# Patient Record
Sex: Female | Born: 1970 | Race: Black or African American | Hispanic: No | Marital: Single | Smoking: Never smoker
Health system: Southern US, Community
[De-identification: ages and names within clinical notes are randomized; demographics above are authoritative.]

## PROBLEM LIST (undated history)

## (undated) DIAGNOSIS — E079 Disorder of thyroid, unspecified: Secondary | ICD-10-CM

## (undated) DIAGNOSIS — C73 Malignant neoplasm of thyroid gland: Secondary | ICD-10-CM

## (undated) HISTORY — PX: THYROIDECTOMY: SHX17

---

## 1998-08-25 ENCOUNTER — Emergency Department (HOSPITAL_COMMUNITY): Admission: EM | Admit: 1998-08-25 | Discharge: 1998-08-25 | Payer: Self-pay | Admitting: Emergency Medicine

## 2000-02-12 ENCOUNTER — Other Ambulatory Visit: Admission: RE | Admit: 2000-02-12 | Discharge: 2000-02-12 | Payer: Self-pay | Admitting: Obstetrics and Gynecology

## 2001-06-22 ENCOUNTER — Emergency Department (HOSPITAL_COMMUNITY): Admission: EM | Admit: 2001-06-22 | Discharge: 2001-06-22 | Payer: Self-pay

## 2001-06-29 ENCOUNTER — Other Ambulatory Visit: Admission: RE | Admit: 2001-06-29 | Discharge: 2001-06-29 | Payer: Self-pay | Admitting: Otolaryngology

## 2001-07-08 ENCOUNTER — Ambulatory Visit (HOSPITAL_COMMUNITY): Admission: RE | Admit: 2001-07-08 | Discharge: 2001-07-08 | Payer: Self-pay | Admitting: Otolaryngology

## 2001-07-08 ENCOUNTER — Encounter: Payer: Self-pay | Admitting: Otolaryngology

## 2001-07-14 ENCOUNTER — Encounter: Payer: Self-pay | Admitting: Otolaryngology

## 2001-07-14 ENCOUNTER — Ambulatory Visit (HOSPITAL_COMMUNITY): Admission: RE | Admit: 2001-07-14 | Discharge: 2001-07-14 | Payer: Self-pay | Admitting: Otolaryngology

## 2001-08-02 ENCOUNTER — Encounter: Payer: Self-pay | Admitting: Otolaryngology

## 2001-08-02 ENCOUNTER — Ambulatory Visit (HOSPITAL_COMMUNITY): Admission: RE | Admit: 2001-08-02 | Discharge: 2001-08-02 | Payer: Self-pay | Admitting: Otolaryngology

## 2001-08-04 ENCOUNTER — Inpatient Hospital Stay (HOSPITAL_COMMUNITY): Admission: RE | Admit: 2001-08-04 | Discharge: 2001-08-07 | Payer: Self-pay | Admitting: Otolaryngology

## 2001-08-18 ENCOUNTER — Ambulatory Visit: Admission: RE | Admit: 2001-08-18 | Discharge: 2001-11-16 | Payer: Self-pay | Admitting: Radiation Oncology

## 2001-09-12 ENCOUNTER — Ambulatory Visit (HOSPITAL_COMMUNITY): Admission: RE | Admit: 2001-09-12 | Discharge: 2001-09-12 | Payer: Self-pay | Admitting: Oncology

## 2001-09-12 ENCOUNTER — Encounter (HOSPITAL_COMMUNITY): Payer: Self-pay | Admitting: Oncology

## 2001-09-26 ENCOUNTER — Ambulatory Visit (HOSPITAL_COMMUNITY): Admission: RE | Admit: 2001-09-26 | Discharge: 2001-09-26 | Payer: Self-pay | Admitting: Oncology

## 2001-09-26 ENCOUNTER — Encounter (HOSPITAL_COMMUNITY): Payer: Self-pay | Admitting: Oncology

## 2002-01-12 ENCOUNTER — Ambulatory Visit (HOSPITAL_COMMUNITY): Admission: RE | Admit: 2002-01-12 | Discharge: 2002-01-12 | Payer: Self-pay | Admitting: Oncology

## 2002-01-12 ENCOUNTER — Encounter (HOSPITAL_COMMUNITY): Payer: Self-pay | Admitting: Oncology

## 2002-04-14 ENCOUNTER — Inpatient Hospital Stay (HOSPITAL_COMMUNITY): Admission: EM | Admit: 2002-04-14 | Discharge: 2002-04-16 | Payer: Self-pay | Admitting: Emergency Medicine

## 2002-04-14 ENCOUNTER — Encounter: Payer: Self-pay | Admitting: Emergency Medicine

## 2002-04-19 ENCOUNTER — Ambulatory Visit (HOSPITAL_COMMUNITY): Admission: RE | Admit: 2002-04-19 | Discharge: 2002-04-19 | Payer: Self-pay | Admitting: Thoracic Surgery

## 2002-04-19 ENCOUNTER — Encounter: Payer: Self-pay | Admitting: Thoracic Surgery

## 2002-06-07 ENCOUNTER — Other Ambulatory Visit: Admission: RE | Admit: 2002-06-07 | Discharge: 2002-06-07 | Payer: Self-pay | Admitting: Obstetrics and Gynecology

## 2002-07-03 ENCOUNTER — Emergency Department (HOSPITAL_COMMUNITY): Admission: EM | Admit: 2002-07-03 | Discharge: 2002-07-03 | Payer: Self-pay | Admitting: Emergency Medicine

## 2002-07-03 ENCOUNTER — Encounter: Payer: Self-pay | Admitting: Emergency Medicine

## 2002-08-09 ENCOUNTER — Encounter: Admission: RE | Admit: 2002-08-09 | Discharge: 2002-08-09 | Payer: Self-pay | Admitting: Otolaryngology

## 2002-08-09 ENCOUNTER — Encounter: Payer: Self-pay | Admitting: Otolaryngology

## 2002-08-29 ENCOUNTER — Emergency Department (HOSPITAL_COMMUNITY): Admission: EM | Admit: 2002-08-29 | Discharge: 2002-08-29 | Payer: Self-pay | Admitting: Emergency Medicine

## 2002-08-29 ENCOUNTER — Encounter: Payer: Self-pay | Admitting: Emergency Medicine

## 2003-01-01 ENCOUNTER — Ambulatory Visit (HOSPITAL_COMMUNITY): Admission: RE | Admit: 2003-01-01 | Discharge: 2003-01-01 | Payer: Self-pay | Admitting: Oncology

## 2003-02-15 ENCOUNTER — Emergency Department (HOSPITAL_COMMUNITY): Admission: EM | Admit: 2003-02-15 | Discharge: 2003-02-15 | Payer: Self-pay | Admitting: Emergency Medicine

## 2003-02-27 ENCOUNTER — Encounter: Admission: RE | Admit: 2003-02-27 | Discharge: 2003-02-27 | Payer: Self-pay | Admitting: Thoracic Surgery

## 2003-03-02 ENCOUNTER — Ambulatory Visit (HOSPITAL_COMMUNITY): Admission: RE | Admit: 2003-03-02 | Discharge: 2003-03-02 | Payer: Self-pay | Admitting: Thoracic Surgery

## 2003-04-06 ENCOUNTER — Encounter: Payer: Self-pay | Admitting: Emergency Medicine

## 2003-04-07 ENCOUNTER — Inpatient Hospital Stay (HOSPITAL_COMMUNITY): Admission: EM | Admit: 2003-04-07 | Discharge: 2003-04-11 | Payer: Self-pay | Admitting: Thoracic Surgery

## 2003-12-03 ENCOUNTER — Emergency Department (HOSPITAL_COMMUNITY): Admission: EM | Admit: 2003-12-03 | Discharge: 2003-12-03 | Payer: Self-pay

## 2004-01-01 ENCOUNTER — Emergency Department (HOSPITAL_COMMUNITY): Admission: EM | Admit: 2004-01-01 | Discharge: 2004-01-01 | Payer: Self-pay | Admitting: Emergency Medicine

## 2004-03-10 IMAGING — CT CT CHEST W/ CM
1 of 2 series · 14 of 30 positions shown, 18 images · IV contrast (omnipaque)
Comparison: 04/14/2002.

CLINICAL DATA: Metastatic Hurthle cell cancer.  
CT CHEST WITH CONTRAST
Multidetector helical CT imaging is performed through the chest following 100 cc Omnipaque 300 IV.

[Series 2: routine chest · axial · 0.61mm/px · z∈[-244,-34]mm · 14 of 50 slices shown, 18 images]
[im 4/50  mediastinal]
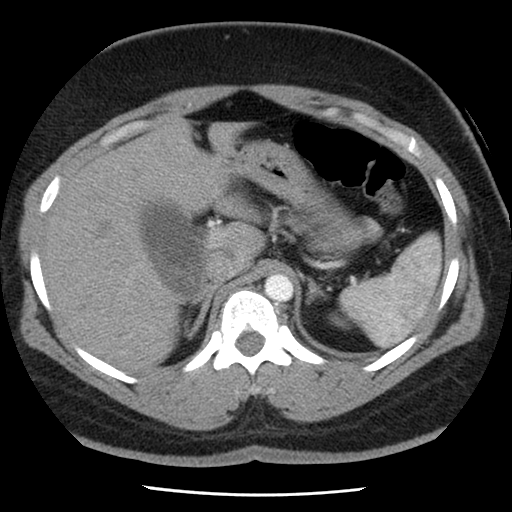
[im 4/50  lung]
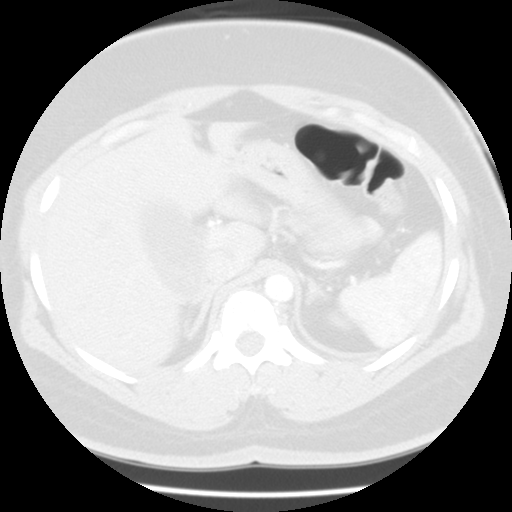
[im 8/50  lung]
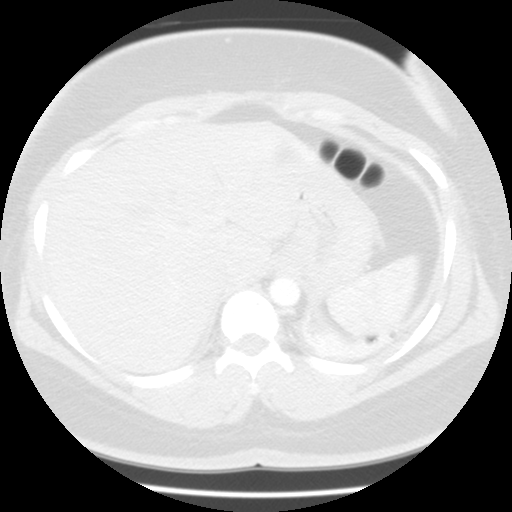
[im 11/50  lung]
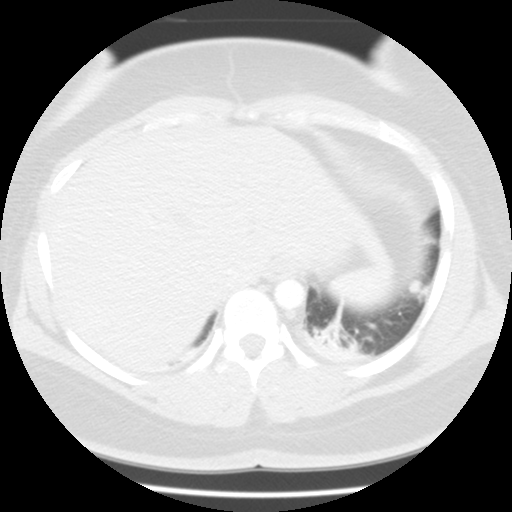
[im 15/50  lung]
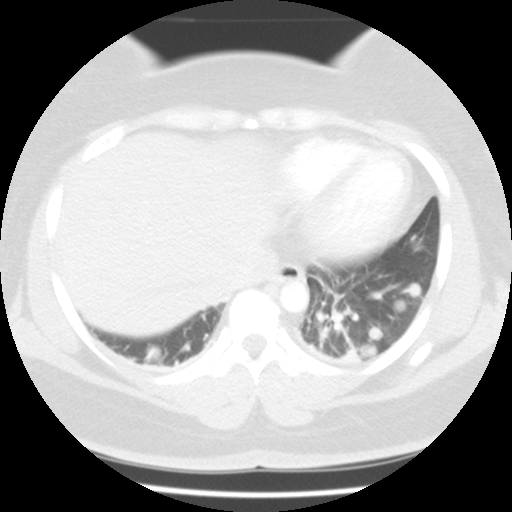
[im 18/50  mediastinal]
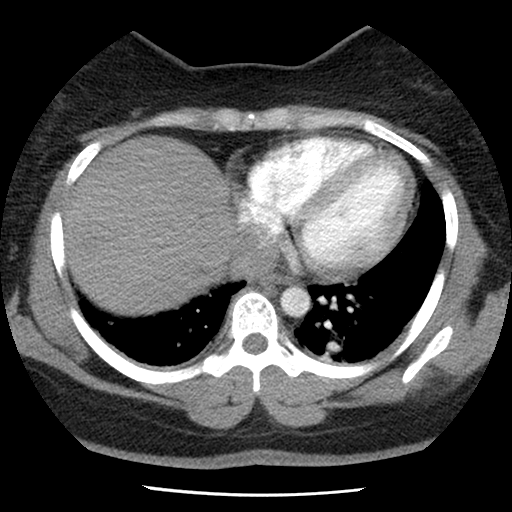
[im 18/50  lung]
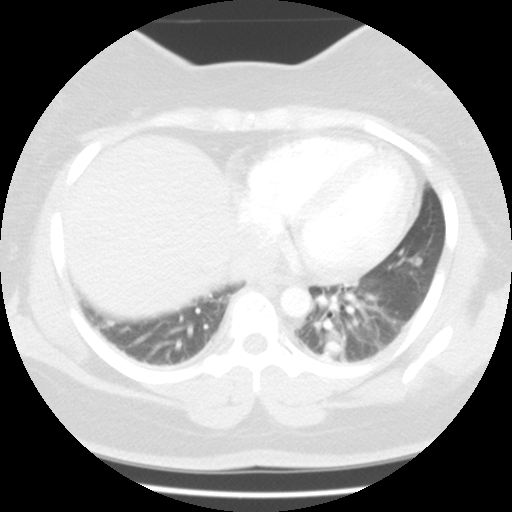
[im 22/50  lung]
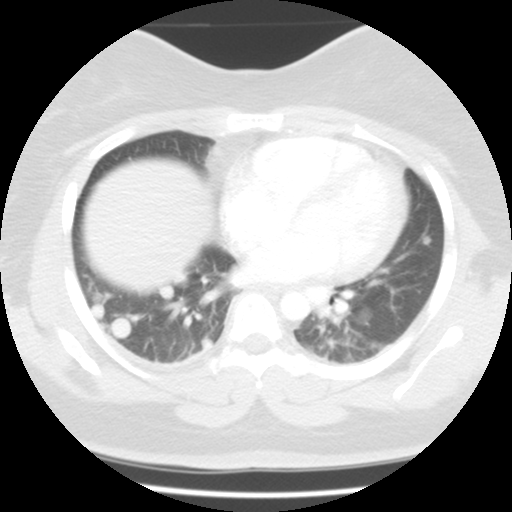
[im 24/50  lung]
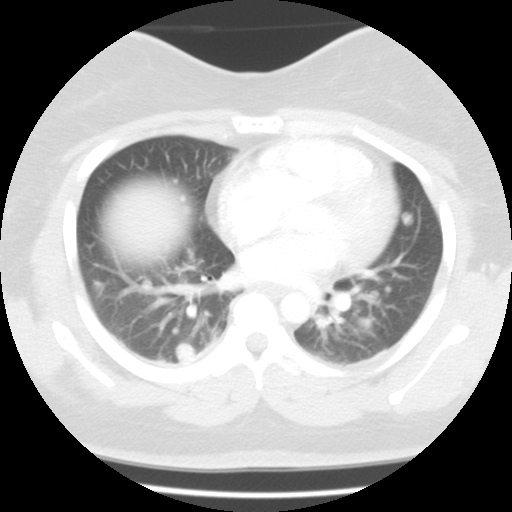
[im 25/50  lung]
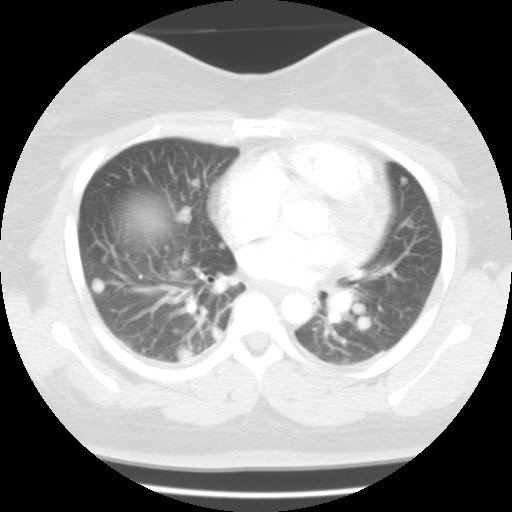
[im 29/50  mediastinal]
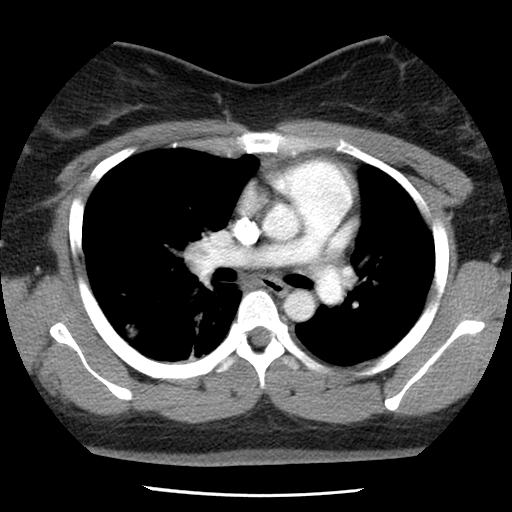
[im 29/50  lung]
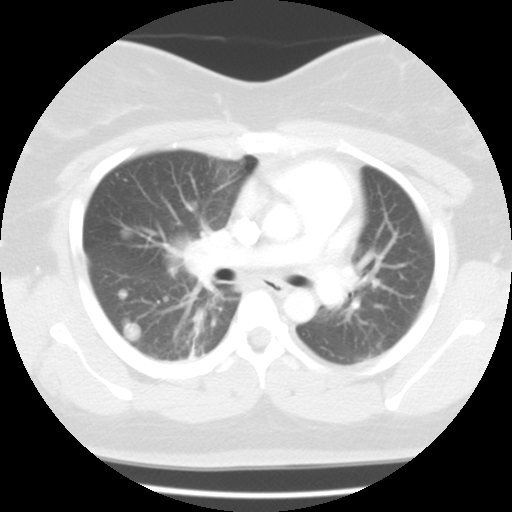
[im 32/50  lung]
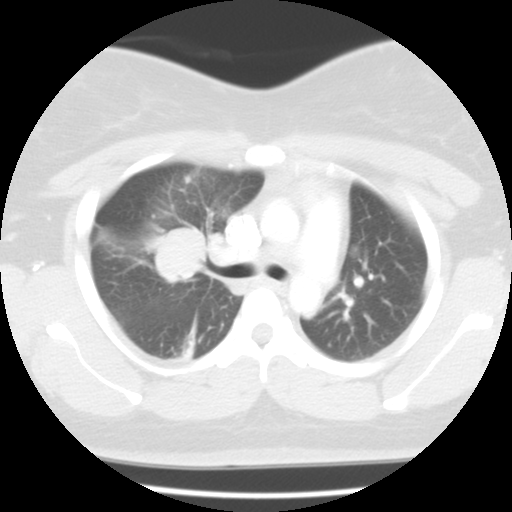
[im 36/50  lung]
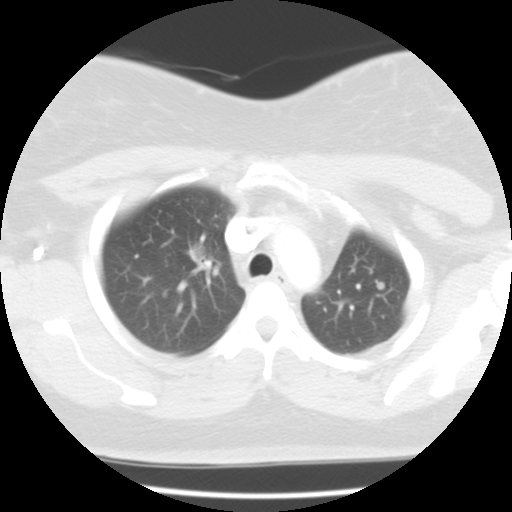
[im 39/50  lung]
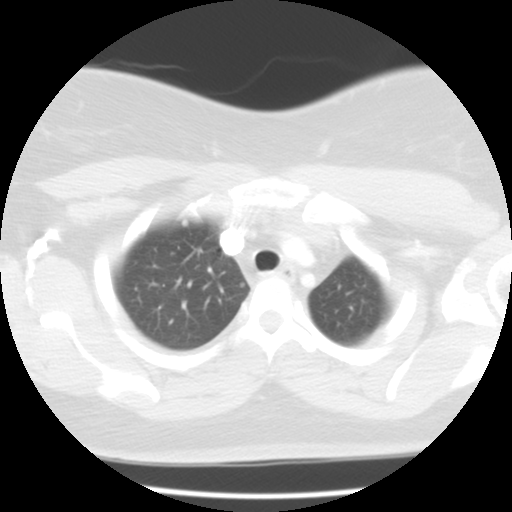
[im 43/50  mediastinal]
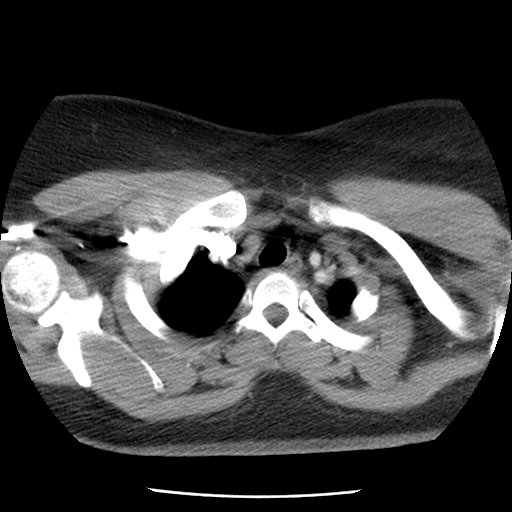
[im 43/50  lung]
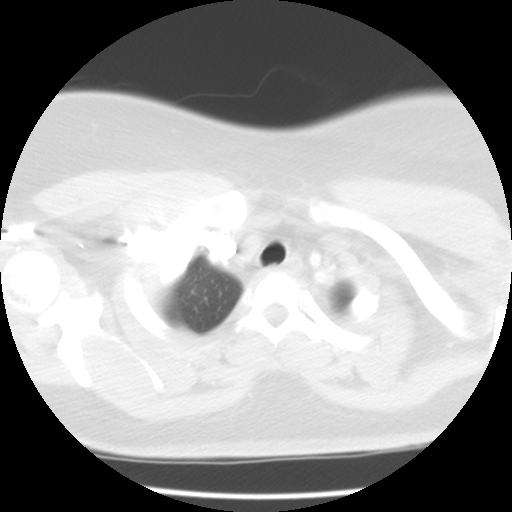
[im 46/50  lung]
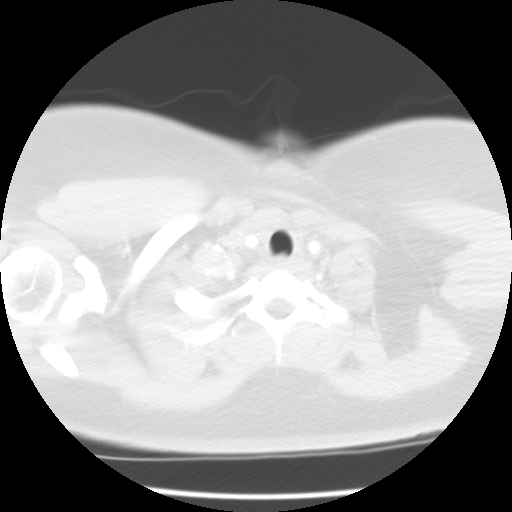

[14 of 30 positions shown; findings below may reference images not displayed]

FINDINGS: There are multiple bilateral pulmonary nodules noted.   These have increased in number and size since prior exam.   The largest mass noted centrally in the right upper lobe measures 3.6 x 2.7 cm on image 19.  Mass previously measured 2.4 x 2.0 cm.  There is bibasilar atelectasis present, left slightly greater than right.  No evidence of mediastinal, hilar or axillary adenopathy.  
Limited images through the upper abdomen are unremarkable.
IMPRESSION
Innumerable bilateral pulmonary nodules increased in number and size since prior study.

## 2004-03-11 IMAGING — XA IR AORTA/THORACIC
1 series · 12 of 24 positions shown · non-contrast
Comparison: none

CLINICAL DATA: Metastatic Hurtle cell thyroid carcinoma to the lung. Recurrent bouts of hemoptysis with no discrete endobronchial component evident on recent bronchoscopy, although the bleeding was localized to the right upper lobe.  
 BRONCHIAL ARTERIOGRAM 
 RIGHT BRONCHIAL ARTERY EMBOLIZATION
TECHNIQUE: Right groin prepped with Betadine and draped in the usual sterile fashion and infiltrated locally with 1% lidocaine after the patient was given intravenous Fentanyl and Versed as conscious sedation during continuous cardiorespiratory monitoring by Radiology R.N.  The right common femoral artery was accessed with a 21-gauge micropuncture needle, exchanged over an 0.018-inch wire for a transitional dilator which allowed advancement of the Bentson wire into the aorta.  Over this, a 5-French C2 catheter was advanced and used to catheterize the intercostal bronchial trunk at the T5 level.    An enlarged right bronchial artery was identified on selective angiography here, with flow to hyperemic lung lesions, consistent with the known metastases including the dominant lesion in the right suprahilar region.  A Renegade high-flow catheter was advanced coaxially through the C2 catheter and used to selectively catheterize the right bronchial artery.  The right bronchial artery and its branches were embolized with 500-700 micron Contour PVA Particles.  The microcatheter was withdrawn and a follow-up angiogram performed, demonstrating stasis of flow in the right bronchial artery with continued flow through the intercostal branches from this trunk.  Selective injections of T6 and T7 right intercostal arteries were performed and no additional enlarged bronchial supply was identified.  The catheter was removed and hemostasis achieved at the site.  No immediate complication.

[Series 1: run · 12 of 35 slices shown]
[im 2/35]
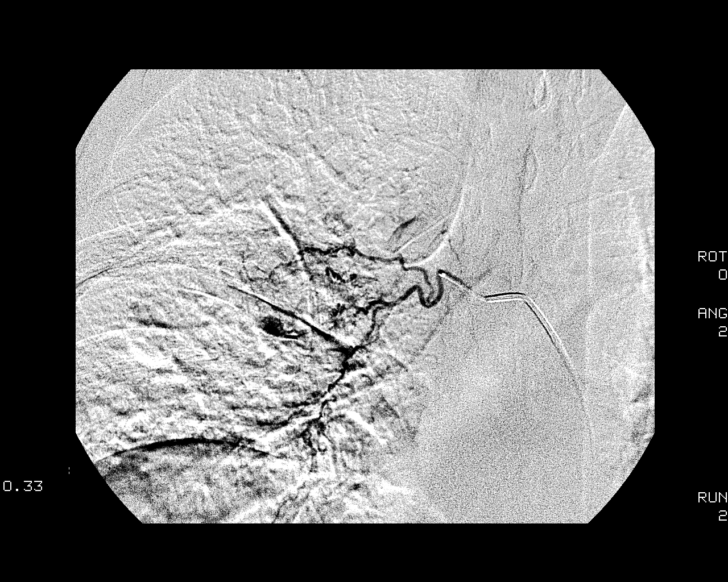
[im 5/35]
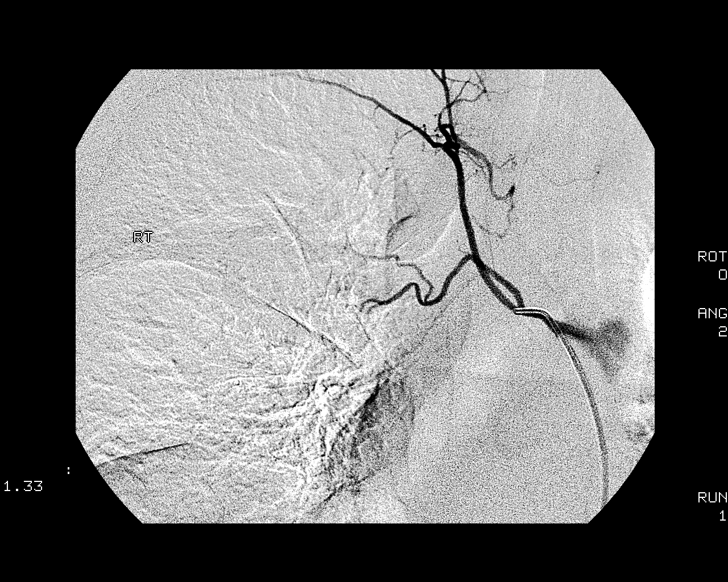
[im 8/35]
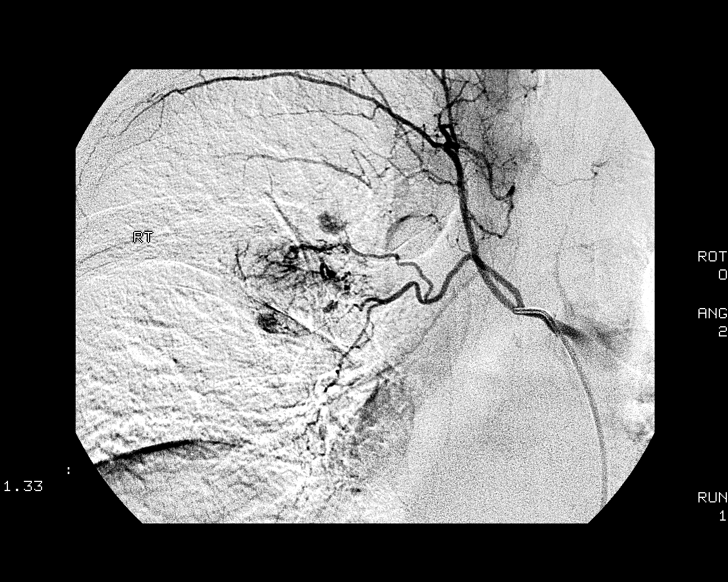
[im 11/35]
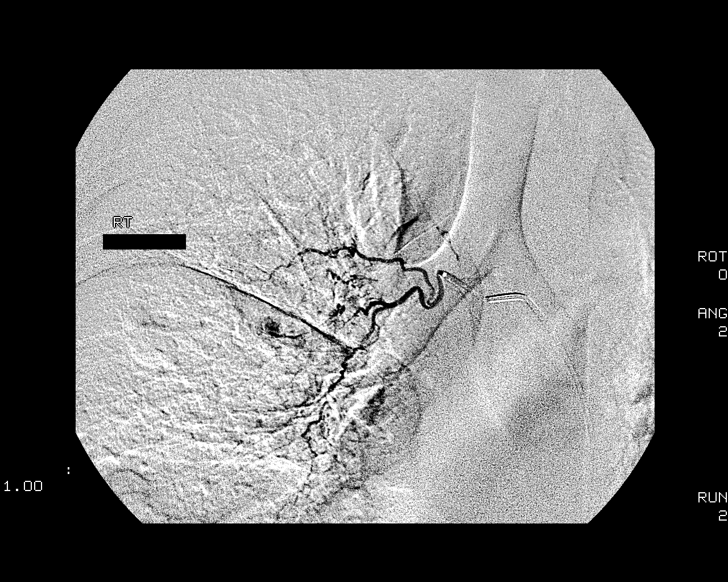
[im 14/35]
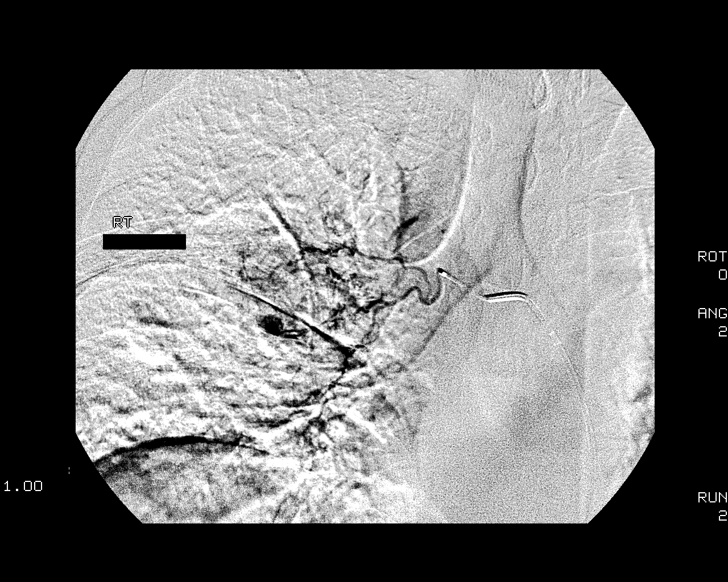
[im 17/35]
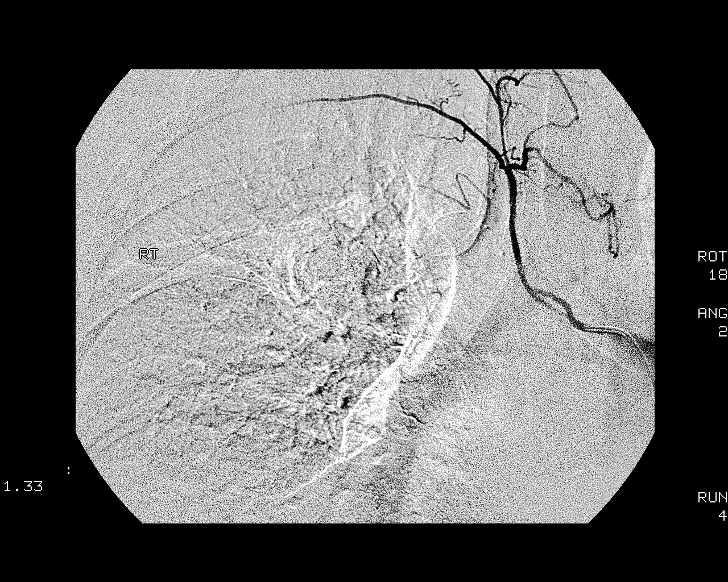
[im 20/35]
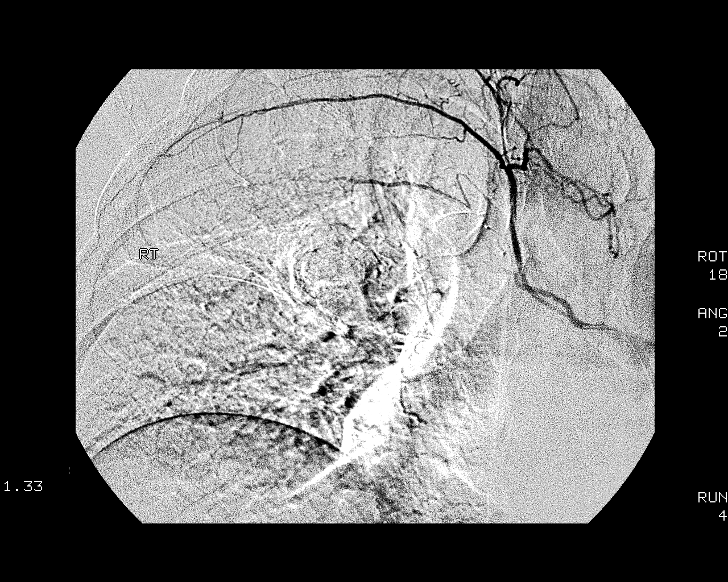
[im 23/35]
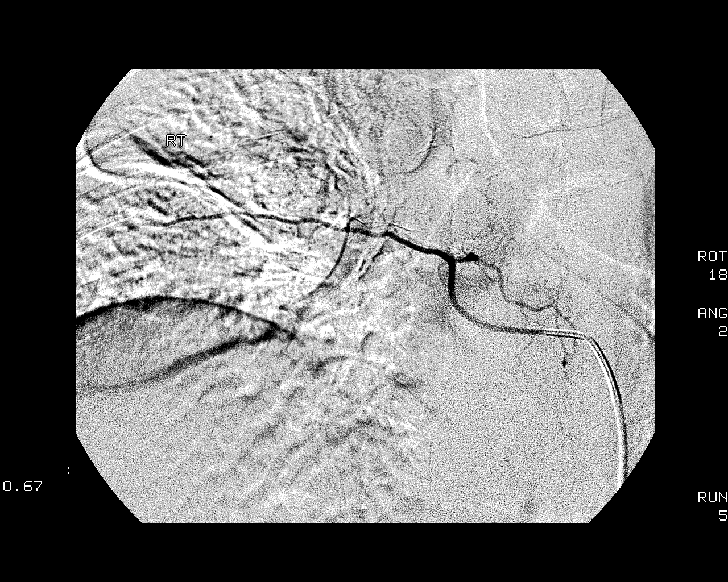
[im 26/35]
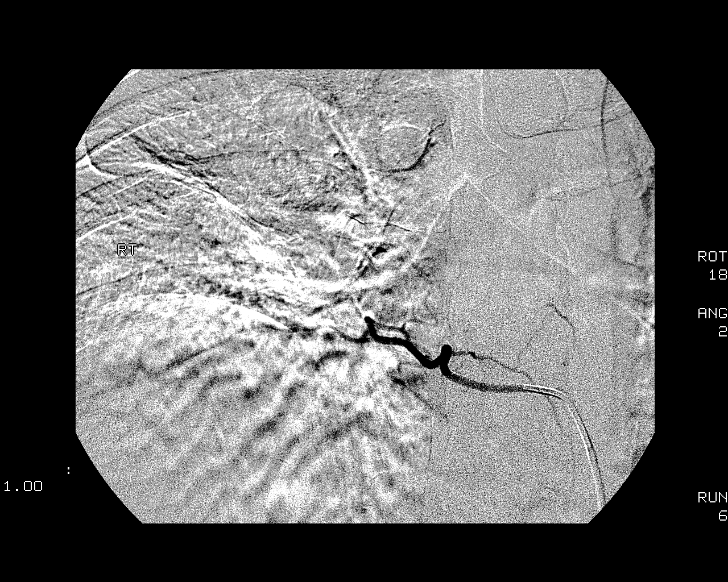
[im 29/35]
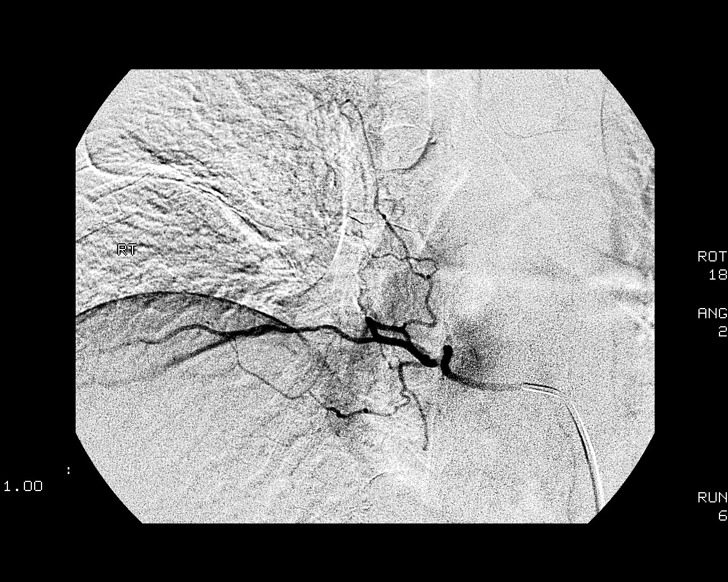
[im 32/35]
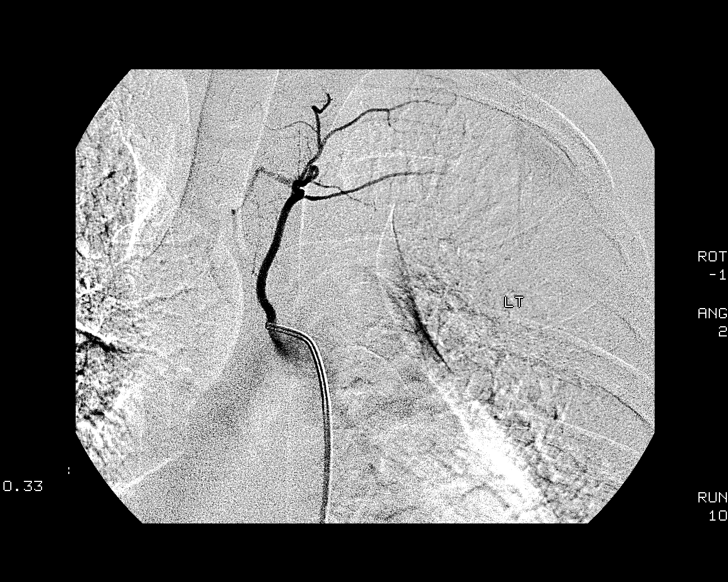
[im 35/35]
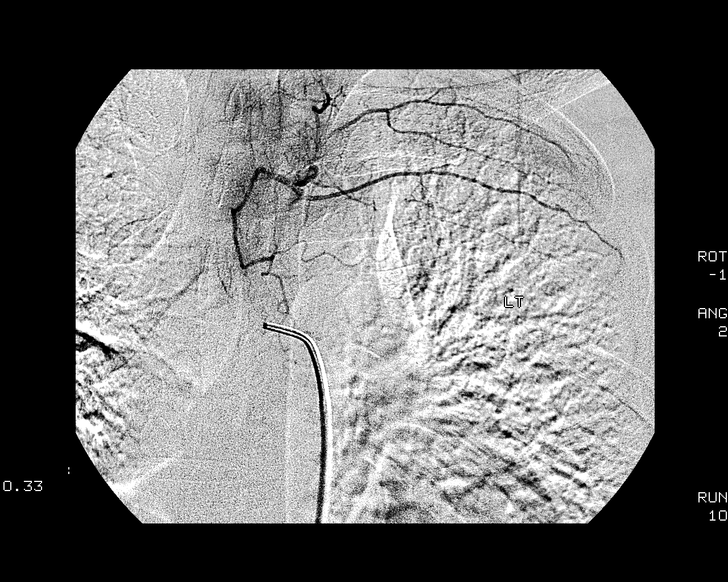

[12 of 24 positions shown; findings below may reference images not displayed]

IMPRESSION: Enlarged right bronchial artery with supply to hyperemic right lung lesions, consistent with the metastases seen on previous CT.
 Technically successful particle embolization of right bronchial artery to stasis without complication.

## 2004-03-13 IMAGING — CR DG CHEST 2V
2 series · 2 of 2 positions shown · non-contrast
Comparison: none

CLINICAL DATA: Lung cancer, hemoptysis, status post bronchoscopy on 04/07/03.
 TWO VIEW CHEST ? 04/11/03 
 Comparison 04/07/03.
 There has been no significant change in the previously demonstrated bilateral lung masses.  The largest mass remains in the right perihilar region.  Additional nodules in the right lower lung zone are better visualized today.  Stable normal-sized heart, low-lung volumes and unremarkable bones.

[view not recorded (1 of 2)]
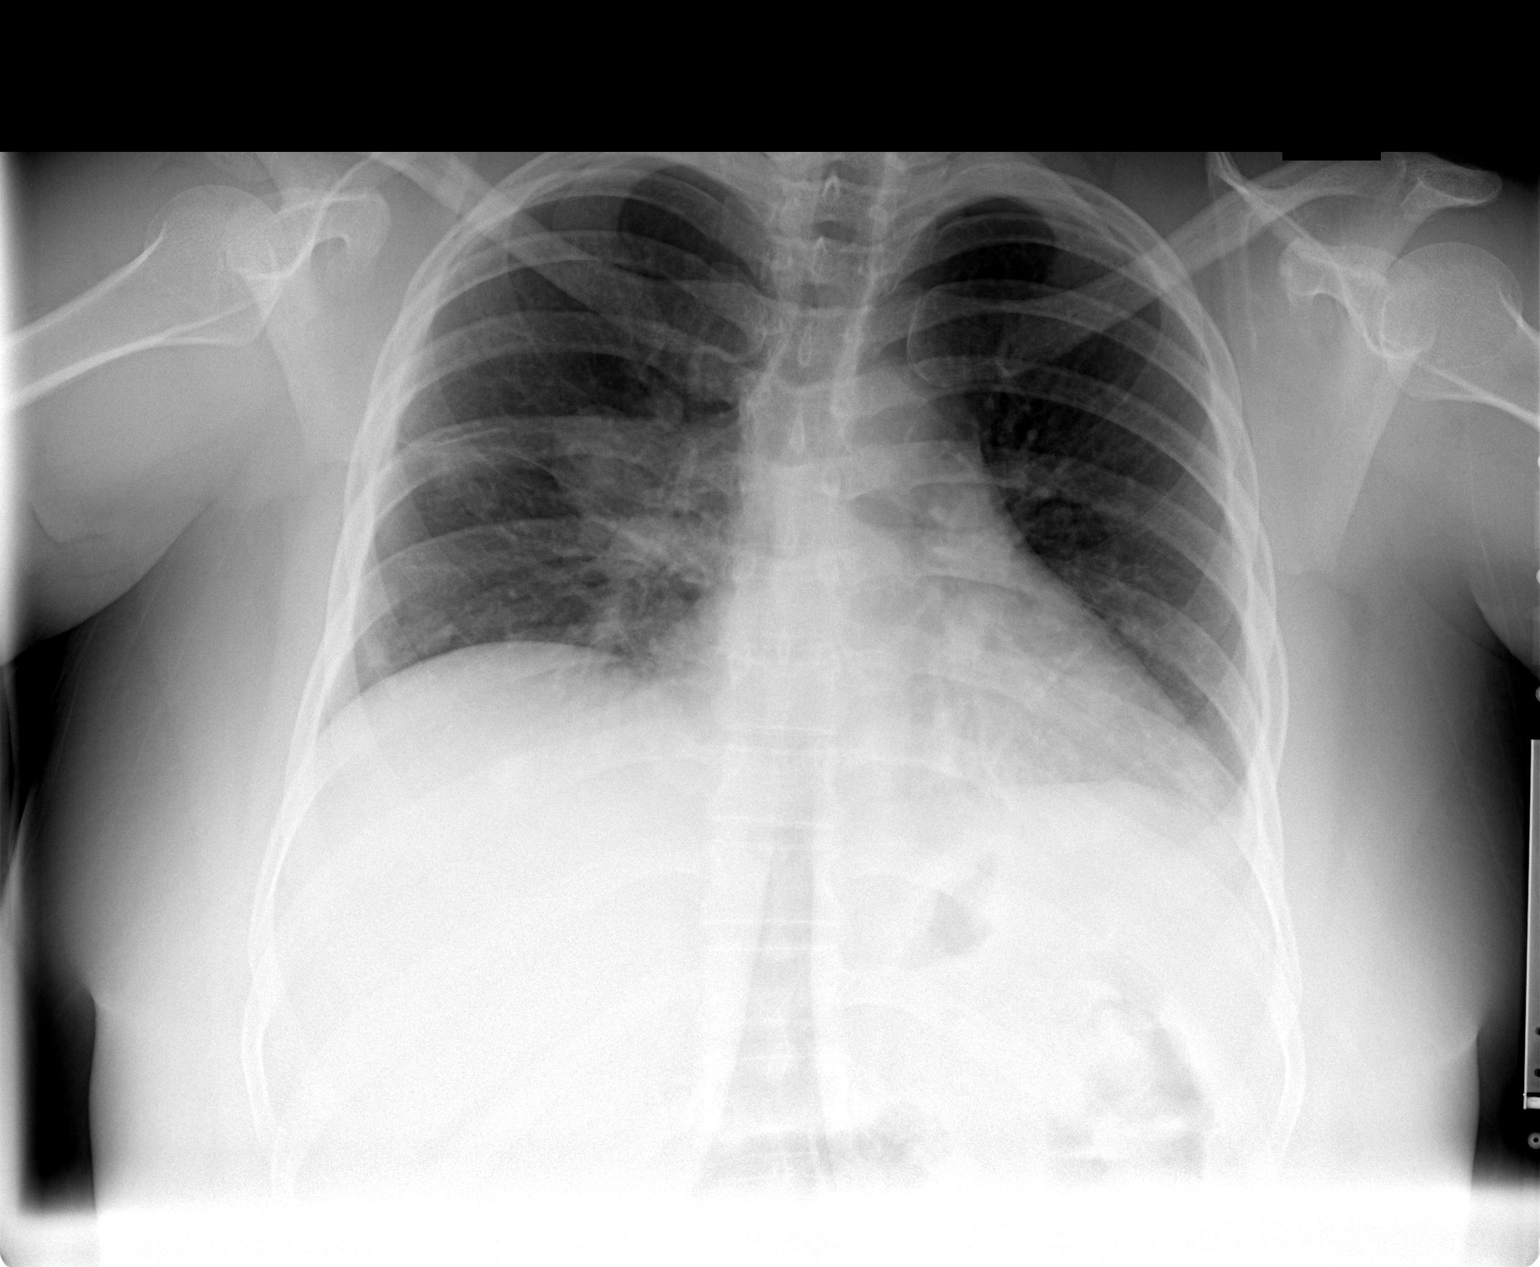

[view not recorded (2 of 2)]
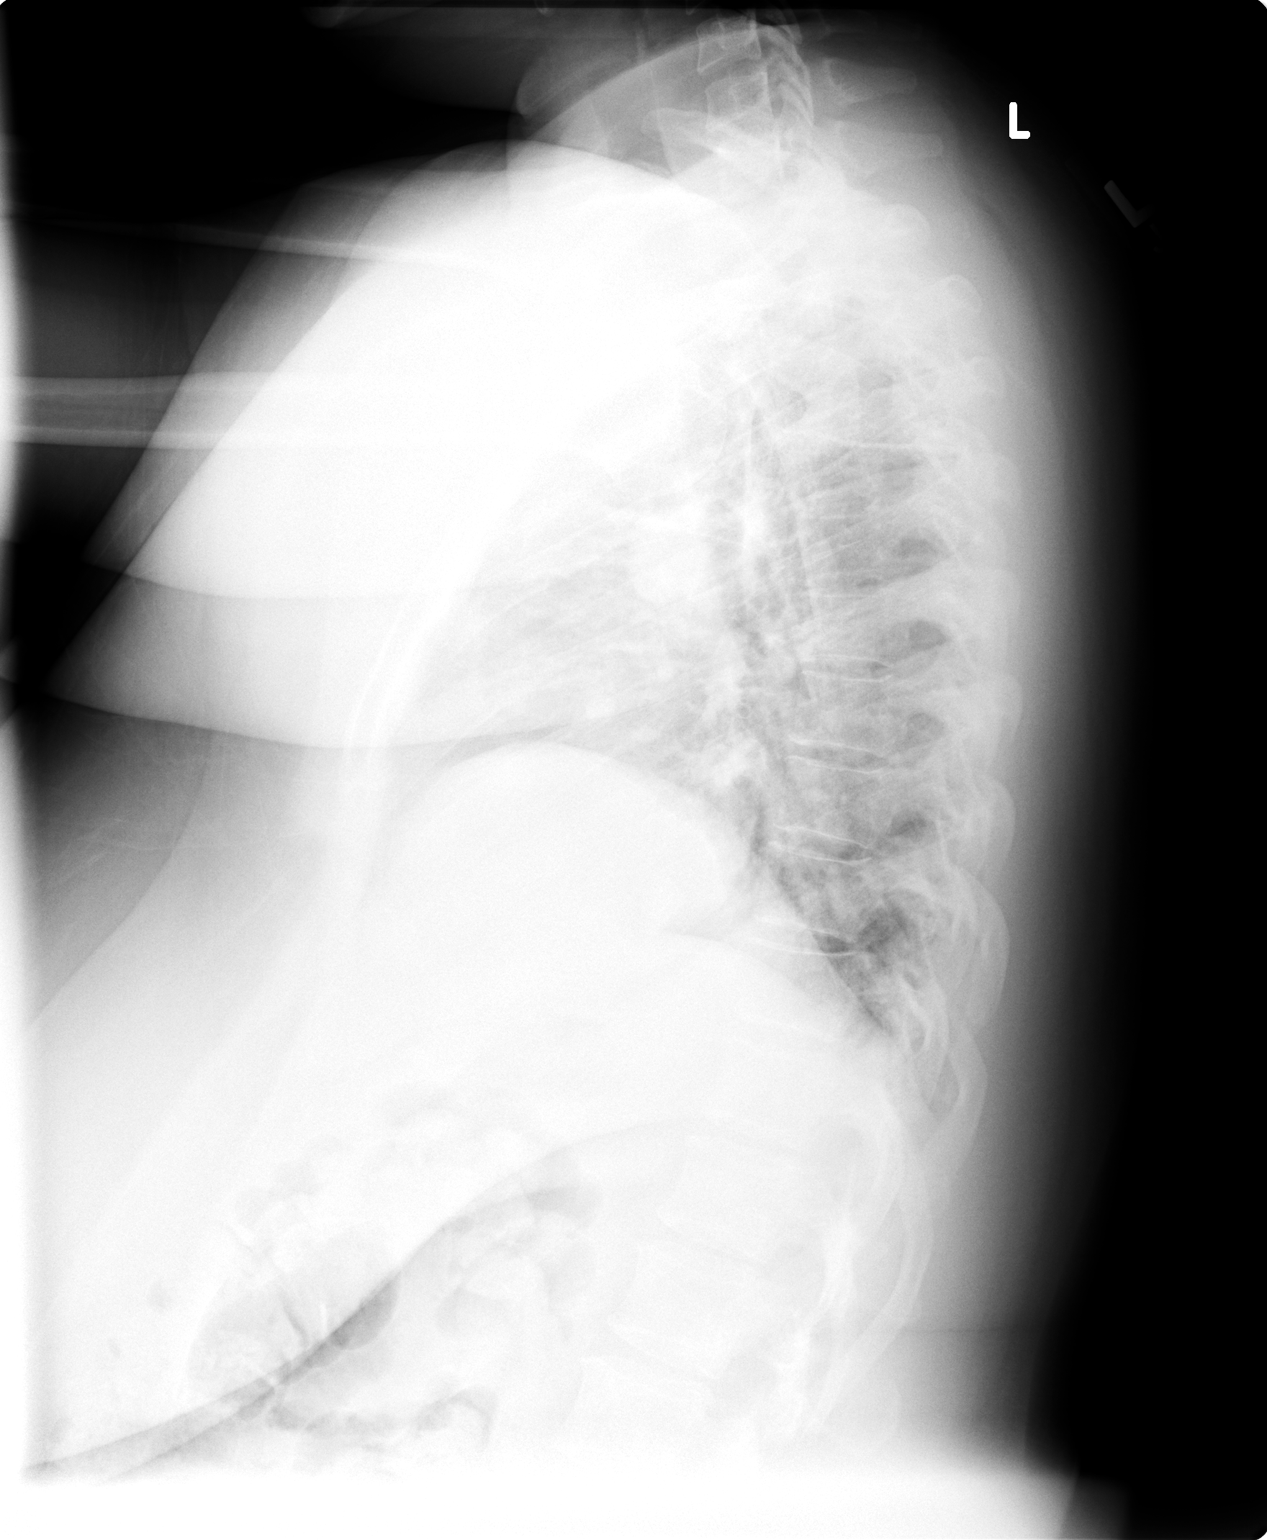

[2 of 2 positions shown; findings below may reference images not displayed]

IMPRESSION: No significant change in bilateral lung masses.

## 2010-03-22 ENCOUNTER — Encounter: Payer: Self-pay | Admitting: Emergency Medicine

## 2010-03-22 ENCOUNTER — Encounter: Payer: Self-pay | Admitting: Thoracic Surgery

## 2010-03-23 ENCOUNTER — Encounter: Payer: Self-pay | Admitting: Thoracic Surgery

## 2010-06-10 ENCOUNTER — Emergency Department (HOSPITAL_COMMUNITY)
Admission: EM | Admit: 2010-06-10 | Discharge: 2010-06-11 | Disposition: A | Discharge status: Home / Self Care | Payer: Medicare Other | Attending: Emergency Medicine | Admitting: Emergency Medicine

## 2010-06-10 DIAGNOSIS — C7800 Secondary malignant neoplasm of unspecified lung: Secondary | ICD-10-CM | POA: Insufficient documentation

## 2010-06-10 DIAGNOSIS — E039 Hypothyroidism, unspecified: Secondary | ICD-10-CM | POA: Insufficient documentation

## 2010-06-10 DIAGNOSIS — N200 Calculus of kidney: Secondary | ICD-10-CM | POA: Insufficient documentation

## 2010-06-10 DIAGNOSIS — Z79899 Other long term (current) drug therapy: Secondary | ICD-10-CM | POA: Insufficient documentation

## 2010-06-10 DIAGNOSIS — C73 Malignant neoplasm of thyroid gland: Secondary | ICD-10-CM | POA: Insufficient documentation

## 2010-06-10 DIAGNOSIS — C7951 Secondary malignant neoplasm of bone: Secondary | ICD-10-CM | POA: Insufficient documentation

## 2010-06-10 DIAGNOSIS — M79609 Pain in unspecified limb: Secondary | ICD-10-CM | POA: Insufficient documentation

## 2010-06-10 LAB — BASIC METABOLIC PANEL
BUN: 4 mg/dL — ABNORMAL LOW (ref 6–23)
CO2: 24 mEq/L (ref 19–32)
Calcium: 8.8 mg/dL (ref 8.4–10.5)
Chloride: 102 mEq/L (ref 96–112)
Creatinine, Ser: 0.87 mg/dL (ref 0.4–1.2)
GFR calc Af Amer: >60 mL/min (ref >60)
GFR calc non Af Amer: >60 mL/min (ref >60)
Glucose, Bld: 89 mg/dL (ref 70–99)
Potassium: 3.9 mEq/L (ref 3.5–5.1)
Sodium: 134 mEq/L — ABNORMAL LOW (ref 135–145)

## 2010-06-10 LAB — CBC
HCT: 39.4 % (ref 36.0–46.0)
Hemoglobin: 12.7 g/dL (ref 12.0–15.0)
MCH: 27 pg (ref 26.0–34.0)
MCHC: 32.2 g/dL (ref 30.0–36.0)
MCV: 83.7 fL (ref 78.0–100.0)
Platelets: 327 10*3/uL (ref 150–400)
RBC: 4.71 MIL/uL (ref 3.87–5.11)
RDW: 14.9 % (ref 11.5–15.5)
WBC: 6.3 10*3/uL (ref 4.0–10.5)

## 2010-06-10 LAB — DIFFERENTIAL
Basophils Absolute: 0 10*3/uL (ref 0.0–0.1)
Basophils Relative: 0 % (ref 0–1)
Eosinophils Absolute: 0.4 10*3/uL (ref 0.0–0.7)
Eosinophils Relative: 7 % — ABNORMAL HIGH (ref 0–5)
Lymphocytes Relative: 29 % (ref 12–46)
Lymphs Abs: 1.8 10*3/uL (ref 0.7–4.0)
Monocytes Absolute: 0.7 10*3/uL (ref 0.1–1.0)
Monocytes Relative: 11 % (ref 3–12)
Neutro Abs: 3.4 10*3/uL (ref 1.7–7.7)
Neutrophils Relative %: 54 % (ref 43–77)

## 2010-06-10 LAB — CK: Total CK: 173 U/L (ref 7–177)

## 2010-06-11 ENCOUNTER — Encounter (HOSPITAL_COMMUNITY): Payer: Self-pay

## 2010-06-11 ENCOUNTER — Emergency Department (HOSPITAL_COMMUNITY): Payer: Medicare Other

## 2011-02-11 ENCOUNTER — Emergency Department (HOSPITAL_COMMUNITY): Payer: Medicare Other

## 2011-02-11 ENCOUNTER — Emergency Department (HOSPITAL_COMMUNITY)
Admission: EM | Admit: 2011-02-11 | Discharge: 2011-02-11 | Disposition: A | Discharge status: Home / Self Care | Payer: Medicare Other | Attending: Emergency Medicine | Admitting: Emergency Medicine

## 2011-02-11 ENCOUNTER — Encounter (HOSPITAL_COMMUNITY): Payer: Self-pay | Admitting: Emergency Medicine

## 2011-02-11 DIAGNOSIS — S32009A Unspecified fracture of unspecified lumbar vertebra, initial encounter for closed fracture: Secondary | ICD-10-CM

## 2011-02-11 DIAGNOSIS — R109 Unspecified abdominal pain: Secondary | ICD-10-CM | POA: Insufficient documentation

## 2011-02-11 DIAGNOSIS — M545 Low back pain, unspecified: Secondary | ICD-10-CM | POA: Insufficient documentation

## 2011-02-11 DIAGNOSIS — M25559 Pain in unspecified hip: Secondary | ICD-10-CM | POA: Insufficient documentation

## 2011-02-11 DIAGNOSIS — C73 Malignant neoplasm of thyroid gland: Secondary | ICD-10-CM | POA: Insufficient documentation

## 2011-02-11 HISTORY — DX: Malignant neoplasm of thyroid gland: C73

## 2011-02-11 HISTORY — DX: Disorder of thyroid, unspecified: E07.9

## 2011-02-11 LAB — URINALYSIS, ROUTINE W REFLEX MICROSCOPIC
Bilirubin Urine: NEGATIVE
Glucose, UA: NEGATIVE mg/dL
Hgb urine dipstick: NEGATIVE
Ketones, ur: NEGATIVE mg/dL
Nitrite: NEGATIVE
Protein, ur: NEGATIVE mg/dL
Specific Gravity, Urine: 1.012 (ref 1.005–1.030)
Urobilinogen, UA: 1 mg/dL (ref 0.0–1.0)
pH: 6 (ref 5.0–8.0)

## 2011-02-11 LAB — PREGNANCY, URINE: Preg Test, Ur: NEGATIVE

## 2011-02-11 LAB — URINE MICROSCOPIC-ADD ON

## 2011-02-11 MED ORDER — MORPHINE SULFATE 4 MG/ML IJ SOLN: 4.0000 mg | Freq: Once | INTRAMUSCULAR | Status: DC

## 2011-02-11 MED ORDER — ONDANSETRON HCL 4 MG PO TABS: 4.0000 mg | ORAL_TABLET | Freq: Four times a day (QID) | ORAL | Status: AC

## 2011-02-11 MED ORDER — MORPHINE SULFATE 2 MG/ML IJ SOLN
INTRAMUSCULAR | Status: AC
  Administered 2011-02-11: 4 mg via INTRAVENOUS
  Filled 2011-02-11: qty 2

## 2011-02-11 MED ORDER — OXYCODONE-ACETAMINOPHEN 5-325 MG PO TABS: 2.0000 | ORAL_TABLET | ORAL | Status: AC | PRN

## 2011-02-11 MED ORDER — ONDANSETRON HCL 4 MG/2ML IJ SOLN
4.0000 mg | Freq: Once | INTRAMUSCULAR | Status: AC
  Administered 2011-02-11: 4 mg via INTRAVENOUS
  Filled 2011-02-11: qty 2

## 2011-02-11 NOTE — ED Provider Notes (Signed)
History    this is a 40 year old female with history of malignant thyroid cancer presents to the ED with chief complaints of low back pain and left hip pain. Patient states for the past week she has been experiencing intermittent pain to her right lower back, now radiating to the left lower back and left hip. Pain is described as sharp, throbbing, and worse with movement. Pain initially alleviate with ibuprofen. She denies any urinary symptoms including dysuria urinary frequency or urgency of burning urination. She denies vaginal discharge. Patient denies fever, chest pain, shortness of breath, abdominal pain, weakness, numbness. She denies urinary or bowel incontinence or caudal equina symptoms. She denies significant history of back pain. Patient does not recall any recent strenuous exercise or heavy lifting.  CSN: [id number] Arrival date & time: 02/11/2011  9:41 AM   First MD Initiated Contact with Patient 02/11/11 1004      Chief Complaint  Patient presents with  . Flank Pain    (Consider location/radiation/quality/duration/timing/severity/associated sxs/prior treatment) HPI  Past Medical History  Diagnosis Date  . Thyroid disease   . Thyroid cancer     No past surgical history on file.  No family history on file.  History  Substance Use Topics  . Smoking status: Not on file  . Smokeless tobacco: Not on file  . Alcohol Use:     OB History    Grav Para Term Preterm Abortions TAB SAB Ect Mult Living                  Review of Systems  All other systems reviewed and are negative.    Allergies  Iodine  Home Medications   Current Outpatient Rx  Name Route Sig Dispense Refill  . CALCIUM-VITAMIN D 500-200 MG-UNIT PO TABS Oral Take 1 tablet by mouth daily.      Marland Kitchen FERROUS SULFATE 325 (65 FE) MG PO TABS Oral Take 325 mg by mouth daily with breakfast.      . IBUPROFEN 600 MG PO TABS Oral Take 600 mg by mouth every 6 (six) hours as needed. For pain     . LEVOTHYROXINE  SODIUM 175 MCG PO TABS Oral Take 175 mcg by mouth daily.        BP 139/99  Pulse 96  Temp(Src) 98.3 F (36.8 C) (Oral)  Resp 16  SpO2 98%  Physical Exam  Nursing note and vitals reviewed. Constitutional: She is oriented to person, place, and time. She appears well-developed and well-nourished. No distress.       Awake, alert, nontoxic appearance  HENT:  Head: Normocephalic and atraumatic.  Eyes: Right eye exhibits no discharge. Left eye exhibits no discharge.  Neck: Neck supple.  Cardiovascular: Normal rate and regular rhythm.   Pulmonary/Chest: Effort normal. She exhibits no tenderness.  Abdominal: Soft. Bowel sounds are normal. There is no tenderness. There is no rebound.  Musculoskeletal:       Baseline ROM, no obvious new focal weakness  Midline cervical, thoracic, lumbar region without pain on palpation. Tenderness to left posterior hip on palpation, without obvious deformity or overlying skin changes. No overlying rash. Sensation intact to light touch.  Neurological: She is alert and oriented to person, place, and time. She has normal strength. She displays no atrophy. No sensory deficit. She exhibits normal muscle tone. Gait normal.       Mental status and motor strength appears baseline for patient and situation  Skin: No rash noted.  Psychiatric: She has a normal mood and  affect.    ED Course  Procedures (including critical care time)   Labs Reviewed  URINALYSIS, ROUTINE W REFLEX MICROSCOPIC  PREGNANCY, URINE   No results found.   No diagnosis found.  Results for orders placed during the hospital encounter of 02/11/11  URINALYSIS, ROUTINE W REFLEX MICROSCOPIC      Component Value Range   Color, Urine YELLOW  YELLOW    APPearance CLEAR  CLEAR    Specific Gravity, Urine 1.012  1.005 - 1.030    pH 6.0  5.0 - 8.0    Glucose, UA NEGATIVE  NEGATIVE (mg/dL)   Hgb urine dipstick NEGATIVE  NEGATIVE    Bilirubin Urine NEGATIVE  NEGATIVE    Ketones, ur NEGATIVE   NEGATIVE (mg/dL)   Protein, ur NEGATIVE  NEGATIVE (mg/dL)   Urobilinogen, UA 1.0  0.0 - 1.0 (mg/dL)   Nitrite NEGATIVE  NEGATIVE    Leukocytes, UA SMALL (*) NEGATIVE   PREGNANCY, URINE      Component Value Range   Preg Test, Ur NEGATIVE    URINE MICROSCOPIC-ADD ON      Component Value Range   Squamous Epithelial / LPF FEW (*) RARE    WBC, UA 7-10  <3 (WBC/hpf)   Bacteria, UA FEW (*) RARE    Urine-Other MUCOUS PRESENT     Dg Lumbar Spine Complete  02/11/2011  *RADIOLOGY REPORT*  Clinical Data: Low back pain.  LUMBAR SPINE - COMPLETE 4+ VIEW  Comparison: CT 12/03/2003  Findings: There are five lumbar-type vertebral bodies.  No fracture or malalignment.  Disc spaces well maintained.  SI joints are symmetric.  IMPRESSION: No acute findings.  Original Report Authenticated By: Cyndie Chime, M.D.   Dg Hip Bilateral W/pelvis  02/11/2011  *RADIOLOGY REPORT*  Clinical Data: Left hip pain.  History of cancer.  BILATERAL HIP WITH PELVIS - 4+ VIEW  Comparison: None  Findings: Hip joints are symmetric and unremarkable.  As are the SI joints. No acute bony abnormality.  Specifically, no fracture, subluxation, or dislocation.  Soft tissues are intact.  Calcified phleboliths in the anatomic pelvis.  IMPRESSION: No acute bony abnormality.  Original Report Authenticated By: Cyndie Chime, M.D.   Ct Lumbar Spine Wo Contrast  02/11/2011  *RADIOLOGY REPORT*  Clinical Data: Back and flank pain. History of thyroid cancer.  CT LUMBAR SPINE WITHOUT CONTRAST  Technique:  Multidetector CT imaging of the lumbar spine was performed without intravenous contrast administration. Multiplanar CT image reconstructions were also generated.  Comparison: Plain films lumbar spine 02/11/2011 11:19 a.m.  Findings: The patient has a mild superior endplate compression fracture involving the posterior one half of the L1 vertebral body with only 0.4 cm of depression.  Prominent basivertebral veins are present at each level but there  appears to be destructive change in the posterior aspect of L1.  This is worrisome for metastatic disease given history of metastatic thyroid cancer.  No other focal bony abnormality is identified.  Vertebral body height is otherwise maintained.  No notable central canal or foraminal stenosis. Visualized paraspinous structures are unremarkable.  IMPRESSION: Mild superior endplate compression fracture of the posterior margin of L1 with bony destructive change worrisome for metastatic disease.  MRI with and without contrast of the lumbar spine is recommend for further evaluation. Critical Value/emergent results were called by telephone at the time of interpretation on 02/11/2011  at 12 noon  to  Dr. Laveda Norman, who verbally acknowledged these results.  Original Report Authenticated By: Bernadene Bell. Maricela Curet, M.D.  MDM  Patient with low back pain in left hip pain. Pain is reproducible on palpation. She has no CVA tenderness. She has no dysuria. She has prior history of thyroid cancer, which is concerning for metastatic disease. CT and imaging of low back and hip obtained. Pain medication given.  Pt has no focal neuro deficits concerning for spinal compression at this time.    12:52 PM CT lumbar spine shows a mild superior endplate compression fracture involving the posterior one half of the L1 vertebral body.  This is worrisome for metastatic disease given her history of thyroid cancer. Although an MRI with and without contrast is the appropriate imaging modality, patient is allergic to IV contrast and she does not have focal neuro deficit. Patient does have a oncology doctor in [place] and is willing to follow up for further management.  Her pain is well controlled with morphine today.  I will prescribe pain medication and follow up instruction at discharge.  Pt voice understanding and agreeing with plan.        Fayrene Helper, PA 02/11/11 1317

## 2011-02-11 NOTE — ED Notes (Signed)
Pt left without signing 

## 2011-02-11 NOTE — ED Notes (Signed)
Patient transported to CT 

## 2011-02-11 NOTE — ED Provider Notes (Signed)
Medical screening examination/treatment/procedure(s) were conducted as a shared visit with non-physician practitioner(s) and myself.  I personally evaluated the patient during the encounter  Denies history of recent trauma/falls. +ho , DM, immunocompromise  injection drug use, immunosuppression, indwelling urinary catheter, prolonged steroid use, skin or urinary tract infection. No numbness/tingling/weakness of extremities. Denies fever/chills. Denies saddle anesthesia, no urinary incontinence or retention.  Ambulatory with steady gait. The patient is neurovasc intact. Her pain is control here. Concern for mets on CT lumbar spine. With min superior endplate compression fracture.  No si/sx cauda equina. She is aware and will f/u with her specialists. Pt given strict precautions for return.  Pt and family comfortable with plan.   Forbes Cellar, MD 02/11/11 1651

## 2011-02-11 NOTE — ED Notes (Signed)
Per ems.  Pt began having some back pain last week.  Upon awakening she experienced severe left lower back pain.  Also has thyroid ca.

## 2011-02-11 NOTE — ED Notes (Signed)
WUJ:[id number]<JY> Expected date:02/11/11<BR> Expected time: 9:25 AM<BR> Means of arrival:Ambulance<BR> Comments:<BR> Cancer patient-severe backpain

## 2012-04-22 ENCOUNTER — Encounter (HOSPITAL_COMMUNITY): Payer: Self-pay | Admitting: *Deleted

## 2012-04-22 ENCOUNTER — Inpatient Hospital Stay (HOSPITAL_COMMUNITY)
Admission: EM | Admit: 2012-04-22 | Discharge: 2012-04-24 | DRG: 204 | Disposition: A | Discharge status: Other Acute Inpatient Hospital | Payer: Medicare Other | Attending: Internal Medicine | Admitting: Internal Medicine

## 2012-04-22 DIAGNOSIS — R05 Cough: Secondary | ICD-10-CM

## 2012-04-22 DIAGNOSIS — C73 Malignant neoplasm of thyroid gland: Secondary | ICD-10-CM | POA: Diagnosis present

## 2012-04-22 DIAGNOSIS — C7800 Secondary malignant neoplasm of unspecified lung: Secondary | ICD-10-CM | POA: Diagnosis present

## 2012-04-22 DIAGNOSIS — D630 Anemia in neoplastic disease: Secondary | ICD-10-CM | POA: Diagnosis present

## 2012-04-22 DIAGNOSIS — R042 Hemoptysis: Principal | ICD-10-CM | POA: Diagnosis present

## 2012-04-22 DIAGNOSIS — Z79899 Other long term (current) drug therapy: Secondary | ICD-10-CM

## 2012-04-22 DIAGNOSIS — C7951 Secondary malignant neoplasm of bone: Secondary | ICD-10-CM | POA: Diagnosis present

## 2012-04-22 NOTE — ED Notes (Signed)
Pt c/o starting to feel bad on past Monday, coughing started Monday. Pt began experiencing blood in her vomit starting today. Pt has hx of thyroid cancer, last chemo in 12/13.

## 2012-04-23 ENCOUNTER — Encounter (HOSPITAL_COMMUNITY): Payer: Self-pay | Admitting: Internal Medicine

## 2012-04-23 ENCOUNTER — Emergency Department (HOSPITAL_COMMUNITY): Payer: Medicare Other

## 2012-04-23 DIAGNOSIS — C73 Malignant neoplasm of thyroid gland: Secondary | ICD-10-CM | POA: Diagnosis present

## 2012-04-23 DIAGNOSIS — C7800 Secondary malignant neoplasm of unspecified lung: Secondary | ICD-10-CM

## 2012-04-23 DIAGNOSIS — C801 Malignant (primary) neoplasm, unspecified: Secondary | ICD-10-CM

## 2012-04-23 DIAGNOSIS — R042 Hemoptysis: Principal | ICD-10-CM

## 2012-04-23 LAB — COMPREHENSIVE METABOLIC PANEL
BUN: 5 mg/dL — ABNORMAL LOW (ref 6–23)
CO2: 24 mEq/L (ref 19–32)
Chloride: 101 mEq/L (ref 96–112)
Creatinine, Ser: 0.6 mg/dL (ref 0.50–1.10)
GFR calc non Af Amer: >90 mL/min (ref >90)
Total Bilirubin: 0.3 mg/dL (ref 0.3–1.2)

## 2012-04-23 LAB — CBC WITH DIFFERENTIAL/PLATELET
Basophils Absolute: 0 K/uL (ref 0.0–0.1)
Basophils Relative: 0 % (ref 0–1)
Eosinophils Absolute: 0.2 K/uL (ref 0.0–0.7)
Eosinophils Relative: 3 % (ref 0–5)
HCT: 27.2 % — ABNORMAL LOW (ref 36.0–46.0)
Hemoglobin: 8.5 g/dL — ABNORMAL LOW (ref 12.0–15.0)
Lymphocytes Relative: 22 % (ref 12–46)
Lymphs Abs: 1.5 K/uL (ref 0.7–4.0)
MCH: 21.5 pg — ABNORMAL LOW (ref 26.0–34.0)
MCHC: 31.3 g/dL (ref 30.0–36.0)
MCV: 68.7 fL — ABNORMAL LOW (ref 78.0–100.0)
Monocytes Absolute: 0.8 K/uL (ref 0.1–1.0)
Monocytes Relative: 12 % (ref 3–12)
Neutro Abs: 4.3 K/uL (ref 1.7–7.7)
Neutrophils Relative %: 63 % (ref 43–77)
Platelets: 406 K/uL — ABNORMAL HIGH (ref 150–400)
RBC: 3.96 MIL/uL (ref 3.87–5.11)
RDW: 18.9 % — ABNORMAL HIGH (ref 11.5–15.5)
WBC: 6.8 K/uL (ref 4.0–10.5)

## 2012-04-23 LAB — CBC
HCT: 26.3 % — ABNORMAL LOW (ref 36.0–46.0)
MCH: 21.4 pg — ABNORMAL LOW (ref 26.0–34.0)
MCV: 68.7 fL — ABNORMAL LOW (ref 78.0–100.0)
RDW: 18.7 % — ABNORMAL HIGH (ref 11.5–15.5)
WBC: 4.8 10*3/uL (ref 4.0–10.5)

## 2012-04-23 LAB — PROTIME-INR
INR: 1.17 (ref 0.00–1.49)
Prothrombin Time: 14.7 seconds (ref 11.6–15.2)

## 2012-04-23 LAB — SAMPLE TO BLOOD BANK

## 2012-04-23 MED ORDER — DEXTROSE-NACL 5-0.9 % IV SOLN
INTRAVENOUS | Status: DC
  Administered 2012-04-23 – 2012-04-24 (×2): via INTRAVENOUS

## 2012-04-23 MED ORDER — HYDROCOD POLST-CHLORPHEN POLST 10-8 MG/5ML PO LQCR
5.0000 mL | Freq: Once | ORAL | Status: AC
  Administered 2012-04-23: 5 mL via ORAL
  Filled 2012-04-23: qty 5

## 2012-04-23 MED ORDER — SODIUM CHLORIDE 0.9 % IJ SOLN
3.0000 mL | Freq: Two times a day (BID) | INTRAMUSCULAR | Status: DC
  Administered 2012-04-23: 22:00:00 via INTRAVENOUS
  Administered 2012-04-23: 3 mL via INTRAVENOUS

## 2012-04-23 MED ORDER — DOCUSATE SODIUM 100 MG PO CAPS
100.0000 mg | ORAL_CAPSULE | Freq: Two times a day (BID) | ORAL | Status: DC
  Administered 2012-04-23 – 2012-04-24 (×2): 100 mg via ORAL
  Filled 2012-04-23 (×4): qty 1

## 2012-04-23 MED ORDER — OXYCODONE HCL 5 MG PO TABS
5.0000 mg | ORAL_TABLET | Freq: Three times a day (TID) | ORAL | Status: DC | PRN
  Administered 2012-04-23 (×2): 5 mg via ORAL
  Filled 2012-04-23 (×2): qty 1

## 2012-04-23 MED ORDER — ONDANSETRON HCL 4 MG PO TABS: 4.0000 mg | ORAL_TABLET | Freq: Four times a day (QID) | ORAL | Status: DC | PRN

## 2012-04-23 MED ORDER — LEVOTHYROXINE SODIUM 175 MCG PO TABS
175.0000 ug | ORAL_TABLET | Freq: Every day | ORAL | Status: DC
  Administered 2012-04-23 – 2012-04-24 (×2): 175 ug via ORAL
  Filled 2012-04-23 (×4): qty 1

## 2012-04-23 MED ORDER — AMLODIPINE BESYLATE 10 MG PO TABS
10.0000 mg | ORAL_TABLET | Freq: Every morning | ORAL | Status: DC
  Administered 2012-04-23 – 2012-04-24 (×2): 10 mg via ORAL
  Filled 2012-04-23 (×2): qty 1

## 2012-04-23 MED ORDER — ONDANSETRON HCL 4 MG/2ML IJ SOLN: 4.0000 mg | Freq: Four times a day (QID) | INTRAMUSCULAR | Status: DC | PRN

## 2012-04-23 MED ORDER — HYDROCODONE-HOMATROPINE 5-1.5 MG/5ML PO SYRP
5.0000 mL | ORAL_SOLUTION | ORAL | Status: DC | PRN
  Administered 2012-04-23 (×2): 5 mL via ORAL
  Filled 2012-04-23 (×2): qty 5

## 2012-04-23 MED ORDER — MORPHINE SULFATE 2 MG/ML IJ SOLN: 2.0000 mg | INTRAMUSCULAR | Status: DC | PRN

## 2012-04-23 NOTE — ED Provider Notes (Signed)
History     CSN: [id number]  Arrival date & time 04/22/12  2323   First MD Initiated Contact with Patient 04/23/12 0045      Chief Complaint  Patient presents with  . Hematemesis    (Consider location/radiation/quality/duration/timing/severity/associated sxs/prior treatment) HPI 42 year old female presents to emergency department from home with complaint of coughing up blood. She reports she developed a cough on Monday. Patient started having hemoptysis today. She reports she's had multiple episodes of hematemesis. One is more than just streaky in the mucus, coating the bottom of the cup that she's coughing into. Patient has gagging with the cough, but does not feel that she is vomiting up blood. Patient has history of metastatic thyroid cancer. Metastasis to bone and to lungs. She is followed at [facility] for this. Patient has felt generalized weakness and dizziness at times. She denies any fever. She has had worsening pain in her left shoulder and back. She has had similar pain in the past due to metastasis. No prior history of PE. Past Medical History  Diagnosis Date  . Thyroid disease   . Thyroid cancer     Metastatic to lungs and bone    Past Surgical History  Procedure Laterality Date  . Thyroidectomy    . Cesarean section      History reviewed. No pertinent family history.  History  Substance Use Topics  . Smoking status: Never Smoker   . Smokeless tobacco: Not on file  . Alcohol Use: No    OB History   Grav Para Term Preterm Abortions TAB SAB Ect Mult Living                  Review of Systems  All other systems reviewed and are negative.    Allergies  Shellfish allergy and Iodine  Home Medications   Current Outpatient Rx  Name  Route  Sig  Dispense  Refill  . amLODipine (NORVASC) 10 MG tablet   Oral   Take 10 mg by mouth every morning.         Marland Kitchen aspirin-sod bicarb-citric acid (ALKA-SELTZER) 325 MG TBEF   Oral   Take 325 mg by mouth every 6 (six)  hours as needed (for cold symptoms or pain).         . Calcium Carbonate-Vitamin D (CALCIUM-VITAMIN D) 500-200 MG-UNIT per tablet   Oral   Take 1 tablet by mouth every morning.          . ferrous sulfate 325 (65 FE) MG tablet   Oral   Take 325 mg by mouth daily with breakfast.           . levothyroxine (SYNTHROID, LEVOTHROID) 175 MCG tablet   Oral   Take 175 mcg by mouth every morning.          . Multiple Vitamin (MULTIVITAMIN WITH MINERALS) TABS   Oral   Take 1 tablet by mouth every morning.         Marland Kitchen oxyCODONE (OXY IR/ROXICODONE) 5 MG immediate release tablet   Oral   Take 5 mg by mouth every 8 (eight) hours as needed for pain.           BP 135/89  Pulse 83  Temp(Src) 98.9 F (37.2 C) (Oral)  Resp 96  SpO2 97%  LMP 04/17/2012  Physical Exam  Nursing note and vitals reviewed. Constitutional: She is oriented to person, place, and time. She appears well-developed and well-nourished. She appears distressed (tearful, uncomfortable appearing).  HENT:  Head: Normocephalic and atraumatic.  Nose: Nose normal.  Mouth/Throat: Oropharynx is clear and moist.  Eyes: Conjunctivae and EOM are normal. Pupils are equal, round, and reactive to light.  Neck: Normal range of motion. Neck supple. No JVD present. No tracheal deviation present. No thyromegaly present.  Cardiovascular: Normal rate, regular rhythm, normal heart sounds and intact distal pulses.  Exam reveals no gallop and no friction rub.   No murmur heard. Pulmonary/Chest: Effort normal and breath sounds normal. No stridor. No respiratory distress. She has no wheezes. She has no rales. She exhibits no tenderness.  Coughing  Abdominal: Soft. Bowel sounds are normal. She exhibits no distension and no mass. There is no tenderness. There is no rebound and no guarding.  Musculoskeletal: Normal range of motion. She exhibits no edema and no tenderness.  Lymphadenopathy:    She has no cervical adenopathy.  Neurological:  She is alert and oriented to person, place, and time. No cranial nerve deficit. She exhibits normal muscle tone. Coordination normal.  Skin: Skin is warm and dry. No rash noted. No erythema. No pallor.  Psychiatric: Her behavior is normal. Judgment and thought content normal.  Tearful    ED Course  Procedures (including critical care time)  Labs Reviewed  CBC WITH DIFFERENTIAL - Abnormal; Notable for the following:    Hemoglobin 8.5 (*)    HCT 27.2 (*)    MCV 68.7 (*)    MCH 21.5 (*)    RDW 18.9 (*)    Platelets 406 (*)    All other components within normal limits  COMPREHENSIVE METABOLIC PANEL - Abnormal; Notable for the following:    Glucose, Bld 103 (*)    BUN 5 (*)    All other components within normal limits  PROTIME-INR  SAMPLE TO BLOOD BANK   Dg Chest 2 View  04/23/2012  *RADIOLOGY REPORT*  Clinical Data: Cough and hemoptysis.  History of thyroid cancer, metastatic to lung and bone.  CHEST - 2 VIEW  Comparison: CT chest 06/11/2010.  Chest 04/11/2003.  Findings: There are numerous bilateral pulmonary parenchymal mass lesions, largest in the right suprahilar region measuring about 3 cm diameter.  The appearance is similar to the previous CT study. There is a destructive bone lesion involving the distal right clavicle which is also stable.  There is interval placement of intramedullary rod and screw fixation of the left humerus across the previous lucent bone lesion.  Normal heart size and pulmonary vascularity.  No pneumothorax.  No acute pulmonary infiltration.  IMPRESSION: Multiple bilateral pulmonary metastasis as previously demonstrated on CT.  Destructive bone lesions in the distal right clavicle and left humerus.  Interval postoperative changes in the left humerus.   Original Report Authenticated By: Burman Nieves, M.D.      1. Hemoptysis   2. Metastatic cancer to lung   3. Cough   4. Lung metastases       MDM  42 year old female with pulmonary metastasis of her  thyroid cancer now with hemoptysis. Concern for possible sentinel bleed given her history of lung metastasis.  She has dropped from 9.9 hgb in Nov to 8.5 here today based on records from [place].  Discussed with Dr Conley Rolls who will admit.        Olivia Mackie, MD 04/23/12 [phone]

## 2012-04-23 NOTE — Progress Notes (Signed)
Pt seen and examined at bedside. Current blood work and vitals reviewed. Pt is hemodynamically stable and denies hemoptysis since admission. Repeat CBC and BMP in AM. Transfer to telemetry bed.  Debbora Presto, MD  [facility] Pager [phone]  If 7PM-7AM, please contact night-coverage [email or web address] Password [email or web address]

## 2012-04-23 NOTE — H&P (Signed)
[facility] History and Physical  Kendra Mendez ZOX:[id number] DOB: 1970-12-07    PCP:   Physician at [facility].  Chief Complaint: Hemoptysis.  HPI: Kendra Mendez is an 42 y.o. 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  She denied fever, chills, nausea, vomiting, black or bloody stool.  She has no respiratory problem except for cough.  Evaluation in the ER showed CXR with metastatic lung lesions, but no infiltrates.  Her WBC is normal and Hb is 8.5 g/DL, normal renal fx tests, normal LFT and Platelet count is 406K.  Her INR is normal.  Hospitalist was asked to admit her for hemoptysis from metastatic thyroid cancer.  Rewiew of Systems:  Constitutional: Negative for malaise, fever and chills. No significant weight loss or weight gain Eyes: Negative for eye pain, redness and discharge, diplopia, visual changes, or flashes of light. ENMT: Negative for ear pain, hoarseness, nasal congestion, sinus pressure and sore throat. No headaches; tinnitus, drooling, or problem swallowing. Cardiovascular: Negative for chest pain, palpitations, diaphoresis, dyspnea and peripheral edema. ; No orthopnea, PND Respiratory: Negative for wheezing and stridor. No pleuritic chestpain. Gastrointestinal: Negative for nausea, vomiting, diarrhea, constipation, abdominal pain, melena, blood in stool, hematemesis, jaundice and rectal bleeding.    Genitourinary: Negative for frequency, dysuria, incontinence,flank pain and hematuria; Musculoskeletal: Negative for back pain and neck pain. Negative for swelling and trauma.;  Skin: . Negative for pruritus, rash, abrasions, bruising and skin lesion.; ulcerations Neuro: Negative for headache, lightheadedness and neck stiffness. Negative for weakness, altered level of consciousness , altered mental status, extremity weakness, burning feet,  involuntary movement, seizure and syncope.  Psych: negative for anxiety, depression, insomnia, tearfulness, panic attacks, hallucinations, paranoia, suicidal or homicidal ideation    Past Medical History  Diagnosis Date  . Thyroid disease   . Thyroid cancer     Metastatic to lungs and bone    Past Surgical History  Procedure Laterality Date  . Thyroidectomy    . Cesarean section      Medications:  HOME MEDS: Prior to Admission medications   Medication Sig Start Date End Date Taking? Authorizing Provider  amLODipine (NORVASC) 10 MG tablet Take 10 mg by mouth every morning.   Yes Historical Provider, MD  aspirin-sod bicarb-citric acid (ALKA-SELTZER) 325 MG TBEF Take 325 mg by mouth every 6 (six) hours as needed (for cold symptoms or pain).   Yes Historical Provider, MD  Calcium Carbonate-Vitamin D (CALCIUM-VITAMIN D) 500-200 MG-UNIT per tablet Take 1 tablet by mouth every morning.    Yes Historical Provider, MD  ferrous sulfate 325 (65 FE) MG tablet Take 325 mg by mouth daily with breakfast.     Yes Historical Provider, MD  levothyroxine (SYNTHROID, LEVOTHROID) 175 MCG tablet Take 175 mcg by mouth every morning.    Yes Historical Provider, MD  Multiple Vitamin (MULTIVITAMIN WITH MINERALS) TABS Take 1 tablet by mouth every morning.   Yes Historical Provider, MD  oxyCODONE (OXY IR/ROXICODONE) 5 MG immediate release tablet Take 5 mg by mouth every 8 (eight) hours as needed for pain.   Yes Historical Provider, MD     Allergies:  Allergies  Allergen Reactions  . Shellfish Allergy Anaphylaxis  . Iodine Itching, Swelling and Rash    Social History:   reports that she has never smoked. She does not have any smokeless tobacco history on file. She reports that  she does not drink alcohol or use illicit drugs.  Family History: History reviewed. No pertinent family history.   Physical Exam: Filed Vitals:   04/22/12 2342 04/23/12 0213 04/23/12 0527  BP: 135/89  117/70  Pulse:  83 91   Temp: 98.9 F (37.2 C)  98.4 F (36.9 C)  TempSrc: Oral  Oral  Resp: 96  20  SpO2: 97% 97% 95%   Blood pressure 117/70, pulse 91, temperature 98.4 F (36.9 C), temperature source Oral, resp. rate 20, last menstrual period 04/17/2012, SpO2 95.00%.  GEN:  Pleasant  patient lying in the stretcher in no acute distress; cooperative with exam. PSYCH:  alert and oriented x4; does not appear anxious or depressed; affect is appropriate. HEENT: Mucous membranes pink and anicteric; PERRLA; EOM intact; no cervical lymphadenopathy nor thyromegaly or carotid bruit; no JVD; There were no stridor. Neck is very supple. Breasts:: Not examined CHEST WALL: No tenderness CHEST: Normal respiration, clear to auscultation bilaterally. No wheezing, no rales. HEART: Regular rate and rhythm.  There are no murmur, rub, or gallops.   BACK: No kyphosis or scoliosis; no CVA tenderness ABDOMEN: soft and non-tender; no masses, no organomegaly, normal abdominal bowel sounds; no pannus; no intertriginous candida. There is no rebound and no distention. Rectal Exam: Not done EXTREMITIES: No bone or joint deformity; age-appropriate arthropathy of the hands and knees; no edema; no ulcerations.  There is no calf tenderness. Genitalia: not examined PULSES: 2+ and symmetric SKIN: Normal hydration no rash or ulceration CNS: Cranial nerves 2-12 grossly intact no focal lateralizing neurologic deficit.  Speech is fluent; uvula elevated with phonation, facial symmetry and tongue midline. DTR are normal bilaterally, cerebella exam is intact, barbinski is negative and strengths are equaled bilaterally.  No sensory loss.   Labs on Admission:  Basic Metabolic Panel:  Recent Labs Lab 04/23/12 0210  NA 137  K 4.2  CL 101  CO2 24  GLUCOSE 103*  BUN 5*  CREATININE 0.60  CALCIUM 9.3   Liver Function Tests:  Recent Labs Lab 04/23/12 0210  AST 19  ALT 7  ALKPHOS 84  BILITOT 0.3  PROT 8.1  ALBUMIN 3.6   No results found  for this basename: LIPASE, AMYLASE,  in the last 168 hours No results found for this basename: AMMONIA,  in the last 168 hours CBC:  Recent Labs Lab 04/23/12 0210  WBC 6.8  NEUTROABS 4.3  HGB 8.5*  HCT 27.2*  MCV 68.7*  PLT 406*   Cardiac Enzymes: No results found for this basename: CKTOTAL, CKMB, CKMBINDEX, TROPONINI,  in the last 168 hours  CBG: No results found for this basename: GLUCAP,  in the last 168 hours   Radiological Exams on Admission: Dg Chest 2 View  04/23/2012  *RADIOLOGY REPORT*  Clinical Data: Cough and hemoptysis.  History of thyroid cancer, metastatic to lung and bone.  CHEST - 2 VIEW  Comparison: CT chest 06/11/2010.  Chest 04/11/2003.  Findings: There are numerous bilateral pulmonary parenchymal mass lesions, largest in the right suprahilar region measuring about 3 cm diameter.  The appearance is similar to the previous CT study. There is a destructive bone lesion involving the distal right clavicle which is also stable.  There is interval placement of intramedullary rod and screw fixation of the left humerus across the previous lucent bone lesion.  Normal heart size and pulmonary vascularity.  No pneumothorax.  No acute pulmonary infiltration.  IMPRESSION: Multiple bilateral pulmonary metastasis as previously demonstrated on CT.  Destructive bone  lesions in the distal right clavicle and left humerus.  Interval postoperative changes in the left humerus.   Original Report Authenticated By: Burman Nieves, M.D.     Assessment/Plan Present on Admission:  . Cough with hemoptysis . Thyroid cancer . Lung metastases  PLAN:  Will admit to SDU just because of her potential respiratory compromise if she bleeds more.  Currently she is very stable. I did speak with Elink briefly, but they asked to please consult pulmonary in the morning as she likely will need bronchoscopy.  I have made her NPO.  I have continued her on her meds.  She is very stable currently, full code, and  will be admitted toTRH service.  Thank you for allowing me to partake in the care of this nice patient.  Other plans as per orders.  Code Status: FULL Unk Lightning, MD. [facility] Pager [phone] 7pm to 7am.  04/23/2012, 6:48 AM

## 2012-04-24 ENCOUNTER — Encounter (HOSPITAL_COMMUNITY): Payer: Self-pay | Admitting: Radiology

## 2012-04-24 ENCOUNTER — Inpatient Hospital Stay (HOSPITAL_COMMUNITY): Payer: Medicare Other

## 2012-04-24 DIAGNOSIS — R042 Hemoptysis: Secondary | ICD-10-CM

## 2012-04-24 DIAGNOSIS — R05 Cough: Secondary | ICD-10-CM

## 2012-04-24 LAB — BASIC METABOLIC PANEL
CO2: 22 mEq/L (ref 19–32)
Calcium: 8.9 mg/dL (ref 8.4–10.5)
Creatinine, Ser: 0.62 mg/dL (ref 0.50–1.10)
Glucose, Bld: 114 mg/dL — ABNORMAL HIGH (ref 70–99)

## 2012-04-24 LAB — CBC
Hemoglobin: 8.1 g/dL — ABNORMAL LOW (ref 12.0–15.0)
MCH: 20.9 pg — ABNORMAL LOW (ref 26.0–34.0)
MCV: 69.1 fL — ABNORMAL LOW (ref 78.0–100.0)
Platelets: 421 10*3/uL — ABNORMAL HIGH (ref 150–400)
RBC: 3.88 MIL/uL (ref 3.87–5.11)

## 2012-04-24 LAB — PROTIME-INR: Prothrombin Time: 15.3 seconds — ABNORMAL HIGH (ref 11.6–15.2)

## 2012-04-24 MED ORDER — LORAZEPAM 2 MG/ML IJ SOLN
0.5000 mg | Freq: Three times a day (TID) | INTRAMUSCULAR | Status: DC | PRN
  Administered 2012-04-24: 0.5 mg via INTRAVENOUS
  Filled 2012-04-24: qty 1

## 2012-04-24 MED ORDER — PANTOPRAZOLE SODIUM 40 MG PO TBEC
40.0000 mg | DELAYED_RELEASE_TABLET | Freq: Every day | ORAL | Status: DC
  Administered 2012-04-24: 40 mg via ORAL
  Filled 2012-04-24: qty 1

## 2012-04-24 MED ORDER — LEVALBUTEROL HCL 1.25 MG/0.5ML IN NEBU
1.2500 mg | INHALATION_SOLUTION | Freq: Three times a day (TID) | RESPIRATORY_TRACT | Status: DC | PRN
  Filled 2012-04-24: qty 0.5

## 2012-04-24 MED ORDER — GI COCKTAIL ~~LOC~~
30.0000 mL | Freq: Three times a day (TID) | ORAL | Status: DC | PRN
Start: 2012-04-24 — End: 2012-04-25
  Filled 2012-04-24: qty 30

## 2012-04-24 MED ORDER — IOHEXOL 350 MG/ML SOLN: 100.0000 mL | Freq: Once | INTRAVENOUS | Status: DC | PRN

## 2012-04-24 NOTE — Progress Notes (Signed)
Pt being transferred to [facility] and report called to the nurse.

## 2012-04-24 NOTE — Discharge Summary (Signed)
Physician Discharge Summary  Kendra Mendez:[id number] DOB: [birth year] DOA: 04/22/2012  PCP: Provider Not In System  Admit date: 04/22/2012 Discharge date: 04/24/2012  Recommendations for Outpatient Follow-up:  1. Kendra Mendez will be transferred to [facility] for further evaluation  Discharge Diagnoses:  Principal Problem:   Cough with hemoptysis Active Problems:   Thyroid cancer   Lung metastases   Discharge Condition: Stable  Diet recommendation: Heart healthy diet discussed in details   Brief narrative:  Kendra Mendez is 42 y.o. 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 She denied fever, chills, nausea, vomiting, black or bloody stool. She has no respiratory problem except for cough. Evaluation in the ER showed CXR with metastatic lung lesions, but no infiltrates. Her WBC is normal and Hb is 8.5 g/DL, normal renal fx tests, normal LFT and Platelet count is 406K. Her INR is normal. Hospitalist was asked to admit her for hemoptysis from metastatic thyroid cancer. Kendra Mendez follow at [facility] with Dr. Dale App[place].  Principal Problem:  Cough with hemoptysis  - possibly related to underlying cancer but exact etiology is unclear  - discussed case with pulmonary and oncology team and recommendation is to transfer to [facility] for further assistance  - Kendra Mendez had total of 2 episodes of hemoptysis while in the hospital - Hg ans remained stable and transfusions required  Active Problems:  Thyroid cancer  Lung metastases  Anemia of chronic disease  - Hg remains stable  Consultants:  None Procedures/Studies:  Dg Chest 2 View 04/23/2012  Multiple bilateral pulmonary metastasis as previously demonstrated on CT.  Destructive bone lesions in the distal right clavicle and left humerus. Interval postoperative changes in the left humerus.  Antibiotics:  None  Discharge Exam: Filed Vitals:   04/24/12 1449  BP: 124/77  Pulse: 95  Temp: 98.4 F (36.9 C)  Resp: 20   Filed Vitals:   04/23/12 2114 04/24/12 0552 04/24/12 0842 04/24/12 1449  BP: 111/71 109/70 108/76 124/77  Pulse: 82 79 87 95  Temp: 97.8 F (36.6 C) 98.7 F (37.1 C) 97.9 F (36.6 C) 98.4 F (36.9 C)  TempSrc: Oral Oral Oral Oral  Resp: 18 18 19 20   Height:      Weight:      SpO2: 98% 97% 98% 100%    General: Kendra Mendez is alert, follows commands appropriately, not in acute distress Cardiovascular: Regular rate and rhythm, S1/S2 +, no murmurs, no rubs, no gallops Respiratory: Clear to auscultation bilaterally, no wheezing, no crackles, no rhonchi Abdominal: Soft, non tender, non distended, bowel sounds +, no guarding Extremities: no edema, no cyanosis, pulses palpable bilaterally DP and Kendra Mendez Neuro: Grossly nonfocal  Discharge Instructions  Discharge Orders   Future Orders Complete By Expires     Diet - low sodium heart healthy  As directed     Increase activity slowly  As directed         Medication List    TAKE these medications       amLODipine 10 MG tablet  Commonly known as:  NORVASC  Take 10 mg by mouth every morning.     aspirin-sod bicarb-citric acid 325 MG Tbef  Commonly known as:  ALKA-SELTZER  Take 325 mg by mouth every 6 (six) hours as needed (for cold symptoms or pain).     calcium-vitamin D 500-200 MG-UNIT per tablet  Take 1 tablet by mouth every  morning.     ferrous sulfate 325 (65 FE) MG tablet  Take 325 mg by mouth daily with breakfast.     levothyroxine 175 MCG tablet  Commonly known as:  SYNTHROID, LEVOTHROID  Take 175 mcg by mouth every morning.     multivitamin with minerals Tabs  Take 1 tablet by mouth every morning.     oxyCODONE 5 MG immediate release tablet  Commonly known as:  Oxy IR/ROXICODONE  Take 5 mg by mouth every 8 (eight) hours as needed for pain.           Follow-up Information   Follow up with Provider Not In System.       The results of  significant diagnostics from this hospitalization (including imaging, microbiology, ancillary and laboratory) are listed below for reference.     Microbiology: Recent Results (from the past 240 hour(s))  MRSA PCR SCREENING     Status: None   Collection Time    04/23/12  6:55 AM      Result Value Range Status   MRSA by PCR NEGATIVE  NEGATIVE Final   Comment:            The GeneXpert MRSA Assay (FDA     approved for NASAL specimens     only), is one component of a     comprehensive MRSA colonization     surveillance program. It is not     intended to diagnose MRSA     infection nor to guide or     monitor treatment for     MRSA infections.     Labs: Basic Metabolic Panel:  Recent Labs Lab 04/23/12 0210 04/24/12 0505  NA 137 135  K 4.2 3.5  CL 101 102  CO2 24 22  GLUCOSE 103* 114*  BUN 5* 8  CREATININE 0.60 0.62  CALCIUM 9.3 8.9   Liver Function Tests:  Recent Labs Lab 04/23/12 0210  AST 19  ALT 7  ALKPHOS 84  BILITOT 0.3  PROT 8.1  ALBUMIN 3.6   CBC:  Recent Labs Lab 04/23/12 0210 04/23/12 1126 04/24/12 0505  WBC 6.8 4.8 5.2  NEUTROABS 4.3  --   --   HGB 8.5* 8.2* 8.1*  HCT 27.2* 26.3* 26.8*  MCV 68.7* 68.7* 69.1*  PLT 406* 384 421*   SIGNED: Time coordinating discharge: Over 30 minutes  Debbora Presto, MD  [facility] 04/24/2012, 5:06 PM Pager [phone] Cell phone [phone]  If 7PM-7AM, please contact night-coverage [email or web address] Password [email or web address]

## 2012-04-24 NOTE — Progress Notes (Signed)
Monitor showed 6 beats of v tach, pt lying in bed, states she feels fine.  Dr. Izola Price notified, will continue to monitor

## 2012-04-24 NOTE — Progress Notes (Signed)
Pt vomited up her lunch and then was coughing profusely, stating she had something caught in her chest and couldn't breathe. Pt continued to wretch and coughed up clear, blood tinged sputum, thin in nature.  This lasted approximately 10 minutes in which time her HR reached 160.  Nurse and family remained until coughing subsided and HR decreased to 120.  Dr. Izola Price notified, orders received.  Will continue to monitor.

## 2012-04-24 NOTE — Progress Notes (Signed)
Pt. Calmed down and fell back asleep. No more vomiting episodes.

## 2012-04-24 NOTE — Progress Notes (Signed)
Pt states that, "One of her pills is stuck in her throat". Pt vomited up bright red blood. Pt got very anxious and nervous. Pt given warm fluids and consoled. Will continue to monitor.

## 2012-04-24 NOTE — Progress Notes (Signed)
Patient ID: Kendra Mendez, female   DOB: February 27, 1971, 42 y.o.   MRN: [id number] [facility] PROGRESS NOTE  SABRIEL BORROMEO WUJ:[id number] DOB: 1970/06/12 DOA: 04/22/2012 PCP: Provider Not In System  Brief narrative: Pt is 42 y.o. 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 She denied fever, chills, nausea, vomiting, black or bloody stool. She has no respiratory problem except for cough. Evaluation in the ER showed CXR with metastatic lung lesions, but no infiltrates. Her WBC is normal and Hb is 8.5 g/DL, normal renal fx tests, normal LFT and Platelet count is 406K. Her INR is normal. Hospitalist was asked to admit her for hemoptysis from metastatic thyroid cancer.  Principal Problem:   Cough with hemoptysis - possibly related to underlying cancer - will ask PCCM for recommendations - supportive care for now Active Problems:   Thyroid cancer   Lung metastases   Anemia of chronic disease  - Hg remains at pt's baseline - CBC in AM  Consultants:  None  Procedures/Studies: Dg Chest 2 View 04/23/2012   Multiple bilateral pulmonary metastasis as previously demonstrated on CT.   Destructive bone lesions in the distal right clavicle and left humerus.  Interval postoperative changes in the left humerus.     Antibiotics:  None  Code Status: Full Family Communication: Pt at bedside Disposition Plan: Home when medically stable  HPI/Subjective: No events overnight.   Objective: Filed Vitals:   04/23/12 1341 04/23/12 2114 04/24/12 0552 04/24/12 0842  BP: 114/74 111/71 109/70 108/76  Pulse: 87 82 79 87  Temp: 98.1 F (36.7 C) 97.8 F (36.6 C) 98.7 F (37.1 C) 97.9 F (36.6 C)  TempSrc: Oral Oral Oral Oral  Resp: 18 18 18 19   Height:      Weight:      SpO2: 97% 98% 97% 98%    Intake/Output Summary (Last 24 hours) at 04/24/12 1125 Last data filed at  04/24/12 0620  Gross per 24 hour  Intake 1326.66 ml  Output    750 ml  Net 576.66 ml    Exam:   General:  Pt is alert, follows commands appropriately, not in acute distress  Cardiovascular: Regular rate and rhythm, S1/S2, no murmurs, no rubs, no gallops  Respiratory: Clear to auscultation bilaterally, no wheezing, no crackles, no rhonchi  Abdomen: Soft, non tender, non distended, bowel sounds present, no guarding  Extremities: No edema, pulses DP and PT palpable bilaterally  Neuro: Grossly nonfocal  Data Reviewed: Basic Metabolic Panel:  Recent Labs Lab 04/23/12 0210 04/24/12 0505  NA 137 135  K 4.2 3.5  CL 101 102  CO2 24 22  GLUCOSE 103* 114*  BUN 5* 8  CREATININE 0.60 0.62  CALCIUM 9.3 8.9   Liver Function Tests:  Recent Labs Lab 04/23/12 0210  AST 19  ALT 7  ALKPHOS 84  BILITOT 0.3  PROT 8.1  ALBUMIN 3.6  CBC:  Recent Labs Lab 04/23/12 0210 04/23/12 1126 04/24/12 0505  WBC 6.8 4.8 5.2  NEUTROABS 4.3  --   --   HGB 8.5* 8.2* 8.1*  HCT 27.2* 26.3* 26.8*  MCV 68.7* 68.7* 69.1*  PLT 406* 384 421*     Recent Results (from the past 240 hour(s))  MRSA PCR SCREENING     Status: None   Collection Time    04/23/12  6:55 AM      Result Value  Range Status   MRSA by PCR NEGATIVE  NEGATIVE Final   Comment:            The GeneXpert MRSA Assay (FDA     approved for NASAL specimens     only), is one component of a     comprehensive MRSA colonization     surveillance program. It is not     intended to diagnose MRSA     infection nor to guide or     monitor treatment for     MRSA infections.     Scheduled Meds: . amLODipine  10 mg Oral q morning - 10a  . docusate sodium  100 mg Oral BID  . levothyroxine  175 mcg Oral QAC breakfast  . pantoprazole  40 mg Oral Daily  . sodium chloride  3 mL Intravenous Q12H   Continuous Infusions: . dextrose 5 % and 0.9% NaCl 50 mL/hr at 04/24/12 0211     Debbora Presto, MD  [facility] Pager [phone]  If  7PM-7AM, please contact night-coverage [email or web address] Password [facility] 04/24/2012, 11:25 AM   LOS: 2 days

## 2013-03-27 ENCOUNTER — Ambulatory Visit: Payer: Medicare Other | Admitting: Physical Therapy

## 2013-03-30 ENCOUNTER — Ambulatory Visit: Payer: Medicare Other | Attending: Orthopedic Surgery | Admitting: Physical Therapy

## 2013-03-30 DIAGNOSIS — M25559 Pain in unspecified hip: Secondary | ICD-10-CM | POA: Insufficient documentation

## 2013-03-30 DIAGNOSIS — IMO0001 Reserved for inherently not codable concepts without codable children: Secondary | ICD-10-CM | POA: Insufficient documentation

## 2013-03-30 DIAGNOSIS — C73 Malignant neoplasm of thyroid gland: Secondary | ICD-10-CM | POA: Insufficient documentation

## 2013-03-30 DIAGNOSIS — M25569 Pain in unspecified knee: Secondary | ICD-10-CM | POA: Insufficient documentation

## 2013-04-04 ENCOUNTER — Ambulatory Visit: Payer: Medicare Other | Admitting: Physical Therapy

## 2013-04-06 ENCOUNTER — Ambulatory Visit: Payer: Medicare Other | Admitting: Physical Therapy

## 2018-02-14 ENCOUNTER — Emergency Department (HOSPITAL_COMMUNITY): Payer: Medicare HMO

## 2018-02-14 ENCOUNTER — Other Ambulatory Visit: Payer: Self-pay

## 2018-02-14 ENCOUNTER — Inpatient Hospital Stay (HOSPITAL_COMMUNITY)
Admission: EM | Admit: 2018-02-14 | Discharge: 2018-02-18 | DRG: 193 | Disposition: A | Discharge status: Home / Self Care | Payer: Medicare HMO | Attending: Internal Medicine | Admitting: Internal Medicine

## 2018-02-14 ENCOUNTER — Encounter (HOSPITAL_COMMUNITY): Payer: Self-pay | Admitting: Emergency Medicine

## 2018-02-14 ENCOUNTER — Other Ambulatory Visit (HOSPITAL_COMMUNITY): Payer: Self-pay

## 2018-02-14 DIAGNOSIS — Z888 Allergy status to other drugs, medicaments and biological substances status: Secondary | ICD-10-CM

## 2018-02-14 DIAGNOSIS — M8458XA Pathological fracture in neoplastic disease, other specified site, initial encounter for fracture: Secondary | ICD-10-CM | POA: Diagnosis present

## 2018-02-14 DIAGNOSIS — C7801 Secondary malignant neoplasm of right lung: Secondary | ICD-10-CM | POA: Diagnosis present

## 2018-02-14 DIAGNOSIS — Z91041 Radiographic dye allergy status: Secondary | ICD-10-CM

## 2018-02-14 DIAGNOSIS — R0603 Acute respiratory distress: Secondary | ICD-10-CM | POA: Diagnosis present

## 2018-02-14 DIAGNOSIS — C7802 Secondary malignant neoplasm of left lung: Secondary | ICD-10-CM | POA: Diagnosis present

## 2018-02-14 DIAGNOSIS — E890 Postprocedural hypothyroidism: Secondary | ICD-10-CM | POA: Diagnosis not present

## 2018-02-14 DIAGNOSIS — C7951 Secondary malignant neoplasm of bone: Secondary | ICD-10-CM | POA: Diagnosis present

## 2018-02-14 DIAGNOSIS — D649 Anemia, unspecified: Secondary | ICD-10-CM | POA: Diagnosis present

## 2018-02-14 DIAGNOSIS — C7800 Secondary malignant neoplasm of unspecified lung: Secondary | ICD-10-CM

## 2018-02-14 DIAGNOSIS — J440 Chronic obstructive pulmonary disease with acute lower respiratory infection: Secondary | ICD-10-CM | POA: Diagnosis present

## 2018-02-14 DIAGNOSIS — R000 Tachycardia, unspecified: Secondary | ICD-10-CM | POA: Diagnosis present

## 2018-02-14 DIAGNOSIS — Z85850 Personal history of malignant neoplasm of thyroid: Secondary | ICD-10-CM

## 2018-02-14 DIAGNOSIS — M8448XA Pathological fracture, other site, initial encounter for fracture: Secondary | ICD-10-CM | POA: Diagnosis present

## 2018-02-14 DIAGNOSIS — E039 Hypothyroidism, unspecified: Secondary | ICD-10-CM | POA: Diagnosis present

## 2018-02-14 DIAGNOSIS — I10 Essential (primary) hypertension: Secondary | ICD-10-CM | POA: Diagnosis present

## 2018-02-14 DIAGNOSIS — C73 Malignant neoplasm of thyroid gland: Secondary | ICD-10-CM | POA: Diagnosis present

## 2018-02-14 DIAGNOSIS — J9601 Acute respiratory failure with hypoxia: Secondary | ICD-10-CM | POA: Diagnosis present

## 2018-02-14 DIAGNOSIS — Z923 Personal history of irradiation: Secondary | ICD-10-CM

## 2018-02-14 DIAGNOSIS — J189 Pneumonia, unspecified organism: Principal | ICD-10-CM | POA: Diagnosis present

## 2018-02-14 DIAGNOSIS — Z9221 Personal history of antineoplastic chemotherapy: Secondary | ICD-10-CM

## 2018-02-14 LAB — CBC WITH DIFFERENTIAL/PLATELET
ABS IMMATURE GRANULOCYTES: 0.02 10*3/uL (ref 0.00–0.07)
BASOS PCT: 0 %
Basophils Absolute: 0 10*3/uL (ref 0.0–0.1)
Eosinophils Absolute: 0 10*3/uL (ref 0.0–0.5)
Eosinophils Relative: 1 %
HCT: 31 % — ABNORMAL LOW (ref 36.0–46.0)
Hemoglobin: 9.8 g/dL — ABNORMAL LOW (ref 12.0–15.0)
IMMATURE GRANULOCYTES: 0 %
Lymphocytes Relative: 18 %
Lymphs Abs: 0.8 10*3/uL (ref 0.7–4.0)
MCH: 27.2 pg (ref 26.0–34.0)
MCHC: 31.6 g/dL (ref 30.0–36.0)
MCV: 86.1 fL (ref 80.0–100.0)
Monocytes Absolute: 0.6 10*3/uL (ref 0.1–1.0)
Monocytes Relative: 14 %
NEUTROS ABS: 3 10*3/uL (ref 1.7–7.7)
NEUTROS PCT: 67 %
PLATELETS: 210 10*3/uL (ref 150–400)
RBC: 3.6 MIL/uL — ABNORMAL LOW (ref 3.87–5.11)
RDW: 19.1 % — ABNORMAL HIGH (ref 11.5–15.5)
WBC: 4.5 10*3/uL (ref 4.0–10.5)
nRBC: 0 % (ref 0.0–0.2)

## 2018-02-14 LAB — COMPREHENSIVE METABOLIC PANEL
ALBUMIN: 4.2 g/dL (ref 3.5–5.0)
ALT: 13 U/L (ref 0–44)
ANION GAP: 18 — AB (ref 5–15)
AST: 30 U/L (ref 15–41)
Alkaline Phosphatase: 70 U/L (ref 38–126)
BILIRUBIN TOTAL: 0.8 mg/dL (ref 0.3–1.2)
BUN: 13 mg/dL (ref 6–20)
CO2: 20 mmol/L — ABNORMAL LOW (ref 22–32)
Calcium: 9.8 mg/dL (ref 8.9–10.3)
Chloride: 102 mmol/L (ref 98–111)
Creatinine, Ser: 0.4 mg/dL — ABNORMAL LOW (ref 0.44–1.00)
GFR calc Af Amer: >60 mL/min (ref >60)
GLUCOSE: 75 mg/dL (ref 70–99)
POTASSIUM: 3.9 mmol/L (ref 3.5–5.1)
Sodium: 140 mmol/L (ref 135–145)
TOTAL PROTEIN: 7.7 g/dL (ref 6.5–8.1)

## 2018-02-14 LAB — I-STAT BETA HCG BLOOD, ED (NOT ORDERABLE): I-stat hCG, quantitative: <5 m[IU]/mL (ref <5)

## 2018-02-14 LAB — POCT I-STAT TROPONIN I: TROPONIN I, POC: 0 ng/mL (ref 0.00–0.08)

## 2018-02-14 MED ORDER — METOPROLOL SUCCINATE ER 25 MG PO TB24: 25.0000 mg | ORAL_TABLET | Freq: Every day | ORAL | Status: DC

## 2018-02-14 MED ORDER — LEVOTHYROXINE SODIUM 25 MCG PO TABS
175.0000 ug | ORAL_TABLET | Freq: Every day | ORAL | Status: DC
  Administered 2018-02-15 – 2018-02-17 (×3): 175 ug via ORAL
  Filled 2018-02-14 (×4): qty 1

## 2018-02-14 MED ORDER — METOPROLOL SUCCINATE ER 25 MG PO TB24
25.0000 mg | ORAL_TABLET | Freq: Every day | ORAL | Status: DC
  Administered 2018-02-15 – 2018-02-18 (×5): 25 mg via ORAL
  Filled 2018-02-14 (×5): qty 1

## 2018-02-14 MED ORDER — IPRATROPIUM-ALBUTEROL 0.5-2.5 (3) MG/3ML IN SOLN: 3.0000 mL | RESPIRATORY_TRACT | Status: DC | PRN

## 2018-02-14 MED ORDER — IPRATROPIUM-ALBUTEROL 0.5-2.5 (3) MG/3ML IN SOLN
3.0000 mL | Freq: Once | RESPIRATORY_TRACT | Status: AC
  Administered 2018-02-14: 3 mL via RESPIRATORY_TRACT
  Filled 2018-02-14: qty 3

## 2018-02-14 MED ORDER — ALBUTEROL SULFATE (2.5 MG/3ML) 0.083% IN NEBU
5.0000 mg | INHALATION_SOLUTION | Freq: Once | RESPIRATORY_TRACT | Status: AC
  Administered 2018-02-14: 5 mg via RESPIRATORY_TRACT
  Filled 2018-02-14: qty 6

## 2018-02-14 MED ORDER — SODIUM CHLORIDE 0.9 % IV SOLN
1.0000 g | Freq: Every day | INTRAVENOUS | Status: DC
  Administered 2018-02-15 – 2018-02-17 (×4): 1 g via INTRAVENOUS
  Filled 2018-02-14 (×2): qty 10
  Filled 2018-02-14 (×3): qty 1

## 2018-02-14 MED ORDER — SODIUM CHLORIDE 0.9% FLUSH
3.0000 mL | Freq: Two times a day (BID) | INTRAVENOUS | Status: DC
  Administered 2018-02-15 – 2018-02-16 (×3): 3 mL via INTRAVENOUS

## 2018-02-14 MED ORDER — FENTANYL CITRATE (PF) 100 MCG/2ML IJ SOLN
25.0000 ug | Freq: Once | INTRAMUSCULAR | Status: AC
  Administered 2018-02-14: 25 ug via INTRAVENOUS
  Filled 2018-02-14: qty 2

## 2018-02-14 MED ORDER — IOPAMIDOL (ISOVUE-300) INJECTION 61%
INTRAVENOUS | Status: AC
  Filled 2018-02-14: qty 100

## 2018-02-14 MED ORDER — TECHNETIUM TC 99M DIETHYLENETRIAME-PENTAACETIC ACID
31.7000 | Freq: Once | INTRAVENOUS | Status: AC | PRN
  Administered 2018-02-14: 31.7 via RESPIRATORY_TRACT

## 2018-02-14 MED ORDER — DIPHENHYDRAMINE HCL 25 MG PO CAPS: 50.0000 mg | ORAL_CAPSULE | Freq: Once | ORAL | Status: DC

## 2018-02-14 MED ORDER — AMLODIPINE BESYLATE 5 MG PO TABS
10.0000 mg | ORAL_TABLET | Freq: Every morning | ORAL | Status: DC
  Administered 2018-02-15: 10 mg via ORAL
  Filled 2018-02-14: qty 2

## 2018-02-14 MED ORDER — MORPHINE SULFATE (PF) 2 MG/ML IV SOLN
2.0000 mg | INTRAVENOUS | Status: DC | PRN
  Administered 2018-02-15 – 2018-02-17 (×2): 2 mg via INTRAVENOUS
  Filled 2018-02-14 (×2): qty 1

## 2018-02-14 MED ORDER — LORAZEPAM 2 MG/ML IJ SOLN
0.5000 mg | Freq: Once | INTRAMUSCULAR | Status: AC
  Administered 2018-02-14: 0.5 mg via INTRAVENOUS
  Filled 2018-02-14: qty 1

## 2018-02-14 MED ORDER — HYDROCOD POLST-CPM POLST ER 10-8 MG/5ML PO SUER
5.0000 mL | Freq: Once | ORAL | Status: AC
  Administered 2018-02-14: 5 mL via ORAL
  Filled 2018-02-14: qty 5

## 2018-02-14 MED ORDER — ACETAMINOPHEN 325 MG PO TABS
650.0000 mg | ORAL_TABLET | Freq: Four times a day (QID) | ORAL | Status: DC | PRN
  Administered 2018-02-17: 650 mg via ORAL
  Filled 2018-02-14: qty 2

## 2018-02-14 MED ORDER — DIPHENHYDRAMINE HCL 50 MG/ML IJ SOLN
50.0000 mg | Freq: Once | INTRAMUSCULAR | Status: DC
  Filled 2018-02-14: qty 1

## 2018-02-14 MED ORDER — OXYCODONE HCL 5 MG PO TABS: 5.0000 mg | ORAL_TABLET | ORAL | Status: DC | PRN

## 2018-02-14 MED ORDER — SODIUM CHLORIDE (PF) 0.9 % IJ SOLN
INTRAMUSCULAR | Status: AC
  Administered 2018-02-15: 3 mL via INTRAVENOUS
  Filled 2018-02-14: qty 50

## 2018-02-14 MED ORDER — ACETAMINOPHEN 650 MG RE SUPP: 650.0000 mg | Freq: Four times a day (QID) | RECTAL | Status: DC | PRN

## 2018-02-14 MED ORDER — HYDROCORTISONE NA SUCCINATE PF 250 MG IJ SOLR
200.0000 mg | Freq: Once | INTRAMUSCULAR | Status: DC
  Filled 2018-02-14: qty 200

## 2018-02-14 MED ORDER — SODIUM CHLORIDE 0.9 % IV SOLN
500.0000 mg | Freq: Every day | INTRAVENOUS | Status: DC
  Administered 2018-02-15 – 2018-02-17 (×4): 500 mg via INTRAVENOUS
  Filled 2018-02-14 (×5): qty 500

## 2018-02-14 MED ORDER — GABAPENTIN 100 MG PO CAPS
100.0000 mg | ORAL_CAPSULE | Freq: Three times a day (TID) | ORAL | Status: DC
  Administered 2018-02-15 – 2018-02-18 (×11): 100 mg via ORAL
  Filled 2018-02-14 (×12): qty 1

## 2018-02-14 MED ORDER — OXYCODONE HCL 5 MG PO TABS
5.0000 mg | ORAL_TABLET | ORAL | Status: DC | PRN
  Administered 2018-02-15 – 2018-02-17 (×5): 5 mg via ORAL
  Filled 2018-02-14 (×5): qty 1

## 2018-02-14 MED ORDER — ONDANSETRON HCL 4 MG/2ML IJ SOLN: 4.0000 mg | Freq: Four times a day (QID) | INTRAMUSCULAR | Status: DC | PRN

## 2018-02-14 MED ORDER — TECHNETIUM TO 99M ALBUMIN AGGREGATED
4.4000 | Freq: Once | INTRAVENOUS | Status: AC | PRN
  Administered 2018-02-14: 4.4 via INTRAVENOUS

## 2018-02-14 MED ORDER — ONDANSETRON HCL 4 MG PO TABS: 4.0000 mg | ORAL_TABLET | Freq: Four times a day (QID) | ORAL | Status: DC | PRN

## 2018-02-14 MED ORDER — SODIUM CHLORIDE 0.9 % IV BOLUS
1000.0000 mL | Freq: Once | INTRAVENOUS | Status: AC
  Administered 2018-02-14: 1000 mL via INTRAVENOUS

## 2018-02-14 NOTE — ED Notes (Signed)
Patient transported to NM scan 

## 2018-02-14 NOTE — H&P (Addendum)
History and Physical    Kendra Mendez MGQ:[id number] DOB: 16-May-1970 DOA: 02/14/2018  Referring MD/NP/PA: Sherrlyn Hock, PA-C PCP: System, Provider Not In  Patient coming from: home  Chief Complaint: Cough  I have personally briefly reviewed patient's old medical records in [place]   HPI: Kendra Mendez is a 47 y.o. female with medical history significant of metastatic thyroid cancer s/p resection, chemotherapy, and other radiation; who presents with complaints of worsening cough and shortness of breath.  Symptoms reportedly started after patient received radiation therapy 3-4 weeks ago after being found to have progression with bone metastases of her thyroid cancer.  Over the last 5 days patient has had a productive cough with whitish sputum production.  Associated symptoms include complaints of of severe back pain, chest pain with coughing, poor appetite, generalized malaise, and fatigue.  Denies having any hemoptysis, fever, chills, nausea, vomiting, or recent sick contacts.  Patient has been taking her oxycodone without significant relief of symptoms.  She did not take her home medications of metoprolol this morning.   ED Course: Upon admission into the emergency department patient was noted to be afebrile, pulse 97-117, respirations 19-43, and O2 saturations 94 to 100% on 2 L nasal cannula oxygen.  Patient was never noted to be hypoxic.  Labs revealed WBC 4.5, hemoglobin 9.8, CO2 20, anion gap 18, and troponin 0.  Chest x-ray sharp progression of pulmonary mets with new compression at T10.  Due to patient's contrast allergy a VQ scan was attempted, but inconclusive of the possibility of a pulmonary embolus.  Patient had been given 1 L of normal saline IV fluids, DuoNeb breathing treatments, 25 mcg of fentanyl for pain, and Tussionex without relief of symptoms.  TRH called to admit due to difficulty breathing.   Review of Systems  Constitutional: Positive for malaise/fatigue.  Negative for chills and fever.  HENT: Negative for congestion and nosebleeds.   Eyes: Negative for photophobia and pain.  Respiratory: Positive for cough, sputum production and shortness of breath. Negative for hemoptysis.   Cardiovascular: Positive for chest pain. Negative for leg swelling.  Gastrointestinal: Negative for abdominal pain and vomiting.  Genitourinary: Negative for dysuria and hematuria.  Musculoskeletal: Positive for back pain.  Skin: Negative for rash.  Neurological: Positive for weakness. Negative for focal weakness.  Psychiatric/Behavioral: Negative for memory loss.    Past Medical History:  Diagnosis Date  . Thyroid cancer ([place])    Metastatic to lungs and bone  . Thyroid disease     Past Surgical History:  Procedure Laterality Date  . CESAREAN SECTION    . THYROIDECTOMY       reports that she has never smoked. She has never used smokeless tobacco. She reports that she does not drink alcohol or use drugs.  Allergies  Allergen Reactions  . Iohexol Hives, Itching and Swelling    Patient is allergic to IV dye and has pre-meds and still allergic to dye;so she was told not to ever have it again.  ak  . Shellfish Allergy Anaphylaxis  . Ace Inhibitors Cough    No family history on file.  Prior to Admission medications   Medication Sig Start Date End Date Taking? Authorizing Provider  amLODipine (NORVASC) 10 MG tablet Take 10 mg by mouth every morning.   Yes [provider]  gabapentin (NEURONTIN) 100 MG capsule Take 100 mg by mouth 3 (three) times daily. 11/12/17 11/12/18 Yes [provider]  levothyroxine (SYNTHROID, LEVOTHROID) 175 MCG tablet Take  175 mcg by mouth every morning.    Yes [provider]  metoprolol succinate (TOPROL-XL) 25 MG 24 hr tablet Take 25 mg by mouth daily. 05/12/17 05/12/18 Yes [provider]  Multiple Vitamin (MULTIVITAMIN WITH MINERALS) TABS Take 1 tablet by mouth every morning.   Yes [provider]  oxyCODONE (OXY IR/ROXICODONE) 5 MG immediate release tablet Take 5 mg by mouth every 5 (five) hours as needed for severe pain.    Yes [provider]  Phenyleph-Doxylamine-DM-APAP (TYLENOL COLD MULTI-SYMPTOM) 5-6.25-10-325 MG/15ML LIQD Take 15 mLs by mouth daily as needed (cough).   Yes [provider]  polyethylene glycol (MIRALAX / GLYCOLAX) packet Take 17 g by mouth daily as needed for constipation. 05/11/17  Yes [provider]    Physical Exam:  Constitutional: Chronically ill-appearing middle-aged female Vitals:   02/14/18 2130 02/14/18 2134 02/14/18 2200 02/14/18 2230  BP: 103/64 113/69 112/72 110/65  Pulse: (!) 114 (!) 114 (!) 117 (!) 112  Resp: (!) 30 (!) 30 (!) 33 (!) 29  Temp:      TempSrc:      SpO2: 96% 97% 96% 97%   Eyes: PERRL, lids and conjunctivae normal ENMT: Mucous membranes are dry. Posterior pharynx clear of any exudate or lesions.  Neck: normal, supple, no masses, no thyromegaly Respiratory: Tachypneic with decreased aeration and rhonchi appreciated throughout both lung fields.  Patient on 2 L nasal cannula oxygen. Cardiovascular: Tachycardic no murmurs / rubs / gallops. No extremity edema. 2+ pedal pulses. No carotid bruits.  Abdomen: no tenderness, no masses palpated. No hepatosplenomegaly. Bowel sounds positive.  Musculoskeletal: no clubbing / cyanosis.  Decreased range of motion of the thoracic spine. Skin: Poor skin turgor.  No rashes, lesions, ulcers. No induration Neurologic: CN 2-12 grossly intact. Sensation intact, DTR normal. Strength 5/5 in all 4.  Psychiatric: Normal judgment and insight. Alert and oriented x 3.  Flat mood and affect   Labs on Admission: I have personally reviewed following labs and imaging studies  CBC: Recent Labs  Lab 02/14/18 1557  WBC 4.5  NEUTROABS 3.0  HGB 9.8*  HCT 31.0*  MCV 86.1  PLT 062   Basic Metabolic Panel: Recent Labs  Lab 02/14/18 1557  NA 140  K 3.9  CL 102    CO2 20*  GLUCOSE 75  BUN 13  CREATININE 0.40*  CALCIUM 9.8   GFR: CrCl cannot be calculated (Unknown ideal weight.). Liver Function Tests: Recent Labs  Lab 02/14/18 1557  AST 30  ALT 13  ALKPHOS 70  BILITOT 0.8  PROT 7.7  ALBUMIN 4.2   No results for input(s): LIPASE, AMYLASE in the last 168 hours. No results for input(s): AMMONIA in the last 168 hours. Coagulation Profile: No results for input(s): INR, PROTIME in the last 168 hours. Cardiac Enzymes: No results for input(s): CKTOTAL, CKMB, CKMBINDEX, TROPONINI in the last 168 hours. BNP (last 3 results) No results for input(s): PROBNP in the last 8760 hours. HbA1C: No results for input(s): HGBA1C in the last 72 hours. CBG: No results for input(s): GLUCAP in the last 168 hours. Lipid Profile: No results for input(s): CHOL, HDL, LDLCALC, TRIG, CHOLHDL, LDLDIRECT in the last 72 hours. Thyroid Function Tests: No results for input(s): TSH, T4TOTAL, FREET4, T3FREE, THYROIDAB in the last 72 hours. Anemia Panel: No results for input(s): VITAMINB12, FOLATE, FERRITIN, TIBC, IRON, RETICCTPCT in the last 72 hours. Urine analysis:    Component Value Date/Time   COLORURINE YELLOW 02/11/2011 1051   APPEARANCEUR  CLEAR 02/11/2011 1051   LABSPEC 1.012 02/11/2011 1051   PHURINE 6.0 02/11/2011 1051   GLUCOSEU NEGATIVE 02/11/2011 1051   HGBUR NEGATIVE 02/11/2011 1051   BILIRUBINUR NEGATIVE 02/11/2011 1051   KETONESUR NEGATIVE 02/11/2011 1051   PROTEINUR NEGATIVE 02/11/2011 1051   UROBILINOGEN 1.0 02/11/2011 1051   NITRITE NEGATIVE 02/11/2011 1051   LEUKOCYTESUR SMALL (A) 02/11/2011 1051   Sepsis Labs: No results found for this or any previous visit (from the past 240 hour(s)).   Radiological Exams on Admission: Dg Chest 2 View  Result Date: 02/14/2018 CLINICAL DATA:  Shortness of breath. Metastatic thyroid cancer. EXAM: CHEST - 2 VIEW COMPARISON:  Chest CT dated 04/24/2012 and chest x-ray dated 04/23/2012 FINDINGS: There has  been a marked increase in the size and number of the metastases throughout both lungs. Heart size and pulmonary vascularity are normal. There are multiple progressive bone metastases. No effusions. New compression fracture of T10. Increased destruction of the distal right clavicle. New lytic lesions in the distal left clavicle and left scapula. Multiple rib metastases are new. IMPRESSION: 1. Marked progression of diffuse bilateral pulmonary metastases. 2. New compression fracture of T10 likely pathologic. 3. Progressive bone metastases. Electronically Signed   By: Lorriane Shire M.D.   On: 02/14/2018 17:22   Nm Pulmonary Perfusion  Result Date: 02/14/2018 CLINICAL DATA:  Shortness of breath and cough with known history of multiple lung metastatic lesions EXAM: NUCLEAR MEDICINE PERFUSION LUNG SCAN TECHNIQUE: Perfusion images were obtained in multiple projections after intravenous injection of radiopharmaceutical. RADIOPHARMACEUTICALS:  4.4 mCi Tc-30m MAA IV COMPARISON:  Chest x-ray from earlier in the same day. FINDINGS: Perfusion images demonstrate multiple rounded defects throughout both lungs which would correspond with the large metastatic lesion seen on recent chest x-ray. Suspicious for wedge-shaped defects are noted on the right laterally as well as on the left lung in the LPO image but given the significant metastatic disease they are not definitive for emboli without a ventilation series. IMPRESSION: Indeterminate scan for pulmonary embolism due to significant defects related to the metastatic disease. CT with premedication could possibly be performed depending on the severity of prior allergic reaction. Electronically Signed   By: Inez Catalina M.D.   On: 02/14/2018 21:29    EKG: Independently reviewed.  Sinus tachycardia 104 bpm  Assessment/Plan Respiratory distress, question community-acquired pneumonia: Acute.  Patient presents with worsening cough and shortness of breath.  Chest x-ray showing  progression of metastatic disease with T10 compression fracture likely pathologic in nature.  Was never noted to be hypoxic, but rhonchi noted throughout both lung fields.  Due to patient's tachycardia and tachypnea in a immunocompromised patient question possibility of a pneumonia.  Symptoms also could be related to pain with T10 compression fracture vs. postradiation pneumonitis vs. PE(d-dimer previously noted to be elevated earlier this year) vs other. - Admit to a telemetry bed - Continuous pulse oximetry with nasal cannula oxygen as needed - Check sputum cultures  - Empiric antibiotics of ceftriaxone and azithromycin - Lovenox per pharmacy for VT treatment  Metastatic follicular thyroid cancer, pathologic fracture: Patient status post resection of left thyroid mass in 1997, chemo therapy, and palliative radiation.  Patient has known mets in scapula and of T10/L3.  She is currently receiving radiation therapy and cannot receive IV contrast due to allergy.  Currently receiving all of her treatment with oncology at [facility].  - Oxycodone per home regimen - Morphine IV as needed severe pain - May want to touch base with patient's  oncologist in a.m. to discuss  Essential hypertension  - Continue metoprolol and amlodipine  Normocytic normochromic anemia: Hemoglobin 9.8 on admission. No reports of bleeding. - Recheck CBC in a.m.  Hypothyroidism: Patient with postsurgical hypothyroidism.  Last thyroid studies performed in November are within normal limits. - Continue levothyroxine  DVT prophylaxis: Lovenox Code Status: Full Family Communication: Discussed plan of care with the patient family present at bedside Disposition Plan: Likely discharge home in 1 to 2 days Consults called: None Admission status: Observation  Norval Morton MD [facility] Pager [phone]   If 7PM-7AM, please contact night-coverage [email or web address] Password [email or web address]  02/14/2018, 11:11 PM

## 2018-02-14 NOTE — ED Provider Notes (Signed)
[facility] DEPT Provider Note   CSN: [id number] Arrival date & time: 02/14/18  1424     History   Chief Complaint Chief Complaint  Patient presents with  . Shortness of Breath  . Cough  . cancer patient    HPI Kendra Mendez is a 47 y.o. female with a past medical history of thyroid cancer status post radiation therapy 2 to 3 weeks ago, currently on oral chemotherapy daily with metastases to the lung and bone who presents to ED for 5-day history of cough productive with mucus, chest pain from coughing, shortness of breath.  States that her family members had similar symptoms.  States that she is coughing up green mucus but denies any hemoptysis.  She has been taking Tylenol, Mucinex with no improvement in her symptoms.  States that the breathing treatment that we gave her gave her some relief.  Denies any fevers, leg swelling, recent travel.  HPI  Past Medical History:  Diagnosis Date  . Thyroid cancer ([place])    Metastatic to lungs and bone  . Thyroid disease     Patient Active Problem List   Diagnosis Date Noted  . Cough with hemoptysis 04/23/2012  . Thyroid cancer ([place]) 04/23/2012  . Lung metastases ([place]) 04/23/2012    Past Surgical History:  Procedure Laterality Date  . CESAREAN SECTION    . THYROIDECTOMY       OB History   No obstetric history on file.      Home Medications    Prior to Admission medications   Medication Sig Start Date End Date Taking? Authorizing Provider  amLODipine (NORVASC) 10 MG tablet Take 10 mg by mouth every morning.   Yes [provider]  gabapentin (NEURONTIN) 100 MG capsule Take 100 mg by mouth 3 (three) times daily. 11/12/17 11/12/18 Yes [provider]  levothyroxine (SYNTHROID, LEVOTHROID) 175 MCG tablet Take 175 mcg by mouth every morning.    Yes [provider]  metoprolol succinate (TOPROL-XL) 25 MG 24 hr tablet Take 25 mg by mouth daily. 05/12/17 05/12/18 Yes [provider]  Multiple Vitamin (MULTIVITAMIN WITH MINERALS) TABS Take 1 tablet by mouth every morning.   Yes [provider]  oxyCODONE (OXY IR/ROXICODONE) 5 MG immediate release tablet Take 5 mg by mouth every 5 (five) hours as needed for severe pain.    Yes [provider]  Phenyleph-Doxylamine-DM-APAP (TYLENOL COLD MULTI-SYMPTOM) 5-6.25-10-325 MG/15ML LIQD Take 15 mLs by mouth daily as needed (cough).   Yes [provider]  polyethylene glycol (MIRALAX / GLYCOLAX) packet Take 17 g by mouth daily as needed for constipation. 05/11/17  Yes [provider]    Family History No family history on file.  Social History Social History   Tobacco Use  . Smoking status: Never Smoker  . Smokeless tobacco: Never Used  Substance Use Topics  . Alcohol use: No  . Drug use: No     Allergies   Iohexol; Shellfish allergy; and Ace inhibitors   Review of Systems Review of Systems  Constitutional: Negative for appetite change, chills and fever.  HENT: Negative for ear pain, rhinorrhea, sneezing and sore throat.   Eyes: Negative for photophobia and visual disturbance.  Respiratory: Positive for cough and shortness of breath. Negative for chest tightness and wheezing.   Cardiovascular: Positive for chest pain. Negative for palpitations.  Gastrointestinal: Negative for abdominal pain, blood in stool, constipation, diarrhea, nausea and vomiting.  Genitourinary: Negative for dysuria, hematuria and urgency.  Musculoskeletal: Negative for myalgias.  Skin: Negative for rash.  Neurological: Negative for dizziness, weakness and light-headedness.     Physical Exam Updated Vital Signs BP 113/69 (BP Location: Right Arm)   Pulse (!) 114   Temp 98.2 F (36.8 C) (Oral)   Resp (!) 30   SpO2 97%   Physical Exam Vitals signs and nursing note reviewed.  Constitutional:      General: She is not in acute distress.    Appearance: She is well-developed.     Comments:  Appears older than stated age.  HENT:     Head: Normocephalic and atraumatic.     Nose: Nose normal.  Eyes:     General: No scleral icterus.       Left eye: No discharge.     Conjunctiva/sclera: Conjunctivae normal.  Neck:     Musculoskeletal: Normal range of motion and neck supple.  Cardiovascular:     Rate and Rhythm: Regular rhythm. Tachycardia present.     Heart sounds: Normal heart sounds. No murmur. No friction rub. No gallop.   Pulmonary:     Effort: Pulmonary effort is normal. Tachypnea present. No respiratory distress.     Breath sounds: Examination of the right-upper field reveals rhonchi. Examination of the left-upper field reveals rhonchi. Examination of the left-middle field reveals rhonchi. Examination of the right-lower field reveals rhonchi. Examination of the left-lower field reveals rhonchi. Rhonchi present.  Abdominal:     General: Bowel sounds are normal. There is no distension.     Palpations: Abdomen is soft.     Tenderness: There is no abdominal tenderness. There is no guarding.  Musculoskeletal: Normal range of motion.  Skin:    General: Skin is warm and dry.     Findings: No rash.  Neurological:     Mental Status: She is alert.     Motor: No abnormal muscle tone.     Coordination: Coordination normal.  Psychiatric:        Mood and Affect: Mood is anxious.      ED Treatments / Results  Labs (all labs ordered are listed, but only abnormal results are displayed) Labs Reviewed  COMPREHENSIVE METABOLIC PANEL - Abnormal; Notable for the following components:      Result Value   CO2 20 (*)    Creatinine, Ser 0.40 (*)    Anion gap 18 (*)    All other components within normal limits  CBC WITH DIFFERENTIAL/PLATELET - Abnormal; Notable for the following components:   RBC 3.60 (*)    Hemoglobin 9.8 (*)    HCT 31.0 (*)    RDW 19.1 (*)    All other components within normal limits  I-STAT TROPONIN, ED  I-STAT BETA HCG BLOOD, ED ([facility], [facility], [facility] ONLY)  I-STAT  BETA HCG BLOOD, ED (NOT ORDERABLE)  POCT I-STAT TROPONIN I    EKG EKG Interpretation  Date/Time:  Monday February 14 2018 14:48:44 EST Ventricular Rate:  104 PR Interval:    QRS Duration: 106 QT Interval:  350 QTC Calculation: 465 R Axis:   78 Text Interpretation:  Sinus tachycardia Low voltage, precordial leads Borderline repolarization abnormality Minimal ST elevation, inferior leads Confirmed by Davonna Belling [phone]) on 02/14/2018 8:36:53 PM   Radiology Dg Chest 2 View  Result Date: 02/14/2018 CLINICAL DATA:  Shortness of breath. Metastatic thyroid cancer. EXAM: CHEST - 2 VIEW COMPARISON:  Chest CT dated 04/24/2012 and chest x-ray dated 04/23/2012 FINDINGS: There has been a marked increase in the size and number  of the metastases throughout both lungs. Heart size and pulmonary vascularity are normal. There are multiple progressive bone metastases. No effusions. New compression fracture of T10. Increased destruction of the distal right clavicle. New lytic lesions in the distal left clavicle and left scapula. Multiple rib metastases are new. IMPRESSION: 1. Marked progression of diffuse bilateral pulmonary metastases. 2. New compression fracture of T10 likely pathologic. 3. Progressive bone metastases. Electronically Signed   By: Lorriane Shire M.D.   On: 02/14/2018 17:22   Nm Pulmonary Perfusion  Result Date: 02/14/2018 CLINICAL DATA:  Shortness of breath and cough with known history of multiple lung metastatic lesions EXAM: NUCLEAR MEDICINE PERFUSION LUNG SCAN TECHNIQUE: Perfusion images were obtained in multiple projections after intravenous injection of radiopharmaceutical. RADIOPHARMACEUTICALS:  4.4 mCi Tc-37m MAA IV COMPARISON:  Chest x-ray from earlier in the same day. FINDINGS: Perfusion images demonstrate multiple rounded defects throughout both lungs which would correspond with the large metastatic lesion seen on recent chest x-ray. Suspicious for wedge-shaped defects are  noted on the right laterally as well as on the left lung in the LPO image but given the significant metastatic disease they are not definitive for emboli without a ventilation series. IMPRESSION: Indeterminate scan for pulmonary embolism due to significant defects related to the metastatic disease. CT with premedication could possibly be performed depending on the severity of prior allergic reaction. Electronically Signed   By: Inez Catalina M.D.   On: 02/14/2018 21:29    Procedures Procedures (including critical care time)  Medications Ordered in ED Medications  iopamidol (ISOVUE-300) 61 % injection (  Canceled Entry 02/14/18 1918)  sodium chloride (PF) 0.9 % injection (has no administration in time range)  albuterol (PROVENTIL) (2.5 MG/3ML) 0.083% nebulizer solution 5 mg (5 mg Nebulization Given 02/14/18 1449)  ipratropium-albuterol (DUONEB) 0.5-2.5 (3) MG/3ML nebulizer solution 3 mL (3 mLs Nebulization Given 02/14/18 1614)  fentaNYL (SUBLIMAZE) injection 25 mcg (25 mcg Intravenous Given 02/14/18 1616)  chlorpheniramine-HYDROcodone (TUSSIONEX) 10-8 MG/5ML suspension 5 mL (5 mLs Oral Given 02/14/18 1743)  sodium chloride 0.9 % bolus 1,000 mL (1,000 mLs Intravenous New Bag/Given 02/14/18 1734)  LORazepam (ATIVAN) injection 0.5 mg (0.5 mg Intravenous Given 02/14/18 1804)  LORazepam (ATIVAN) injection 0.5 mg (0.5 mg Intravenous Given 02/14/18 1935)  technetium TC 67M diethylenetriame-pentaacetic acid (DTPA) injection 24.5 millicurie (80.9 millicuries Inhalation Given 02/14/18 1923)  technetium albumin aggregated (MAA) injection solution 4.4 millicurie (4.4 millicuries Intravenous Contrast Given 02/14/18 2040)     Initial Impression / Assessment and Plan / ED Course  I have reviewed the triage vital signs and the nursing notes.  Pertinent labs & imaging results that were available during my care of the patient were reviewed by me and considered in my medical decision making (see chart for  details).  Clinical Course as of Feb 15 2240  Mon Feb 14, 2018  1821 Patient hyperventilating, stating that she feels like she cannot breathe.  States that she continues to have a cough.  States that "I do not want to cough anymore."  Patient given Ativan, instructed on breathing exercises which calmed her down.   [HK]    Clinical Course User Index [HK] Delia Heady, PA-C    47 year old female with a past medical history of thyroid cancer status post radiation therapy 2 to 3 weeks ago for lung and bone mets, currently on oral chemotherapy daily presents to ED for 5-day history of cough productive with mucus, chest pain and shortness of breath.  States that she is coughing  up green mucus denies any hemoptysis.  No improvement with over-the-counter medications.  On exam patient tachycardic, tachypneic.  Oxygen saturations 94 to 95% on room air.  She has notable rhonchi throughout bilateral lung fields.  She did note some improvement with 2 duo nebs although there is no wheezing noted on exam.  She was given Ativan to help with her anxiety and hyperventilation.  She is afebrile.  EKG shows sinus tachycardia.  Chest x-ray shows diffuse mets with market progression and new compression fracture of T10.  Troponin is negative.  Hemoglobin is 9.8 which appears similar to priors.  Patient has a severe IV contrast allergy with note stating that even with premedication, they recommend that she "never gets it again."  Tried to do a VQ scan but she was unable to undergo the ventilation portion of this.  This was inconclusive for a PE.  I feel that patient will need admission for definitive ruling out of PE as she is high risk. Will consult hospitalist for admission. Patient discussed with my attending, Dr. Alvino Chapel.    Portions of this note were generated with [employer]. Dictation errors may occur despite best attempts at proofreading.  Final Clinical Impressions(s) / ED Diagnoses   Final  diagnoses:  Malignant neoplasm metastatic to lung, unspecified laterality [facility])    ED Discharge Orders    None       Delia Heady, PA-C 02/14/18 2329    Davonna Belling, MD 02/15/18 [phone]

## 2018-02-14 NOTE — H&P (Deleted)
  The note originally documented on this encounter has been moved the the encounter in which it belongs.  

## 2018-02-14 NOTE — ED Notes (Signed)
Pt is requesting pain medication. 

## 2018-02-14 NOTE — ED Triage Notes (Signed)
Pt been SOB, in lots of pain and cough is productive since last Thursday. Pt not currently getting any chemo treatments.

## 2018-02-15 ENCOUNTER — Encounter (HOSPITAL_COMMUNITY): Payer: Self-pay

## 2018-02-15 ENCOUNTER — Inpatient Hospital Stay (HOSPITAL_COMMUNITY): Payer: Medicare HMO

## 2018-02-15 ENCOUNTER — Other Ambulatory Visit: Payer: Self-pay

## 2018-02-15 DIAGNOSIS — M7989 Other specified soft tissue disorders: Secondary | ICD-10-CM | POA: Diagnosis not present

## 2018-02-15 DIAGNOSIS — R609 Edema, unspecified: Secondary | ICD-10-CM | POA: Diagnosis not present

## 2018-02-15 DIAGNOSIS — D649 Anemia, unspecified: Secondary | ICD-10-CM | POA: Diagnosis present

## 2018-02-15 DIAGNOSIS — R000 Tachycardia, unspecified: Secondary | ICD-10-CM | POA: Diagnosis present

## 2018-02-15 DIAGNOSIS — J189 Pneumonia, unspecified organism: Principal | ICD-10-CM

## 2018-02-15 DIAGNOSIS — Z9221 Personal history of antineoplastic chemotherapy: Secondary | ICD-10-CM | POA: Diagnosis not present

## 2018-02-15 DIAGNOSIS — J440 Chronic obstructive pulmonary disease with acute lower respiratory infection: Secondary | ICD-10-CM | POA: Diagnosis present

## 2018-02-15 DIAGNOSIS — M8458XA Pathological fracture in neoplastic disease, other specified site, initial encounter for fracture: Secondary | ICD-10-CM | POA: Diagnosis present

## 2018-02-15 DIAGNOSIS — M8448XA Pathological fracture, other site, initial encounter for fracture: Secondary | ICD-10-CM | POA: Diagnosis present

## 2018-02-15 DIAGNOSIS — Z91041 Radiographic dye allergy status: Secondary | ICD-10-CM | POA: Diagnosis not present

## 2018-02-15 DIAGNOSIS — Z923 Personal history of irradiation: Secondary | ICD-10-CM | POA: Diagnosis not present

## 2018-02-15 DIAGNOSIS — C7951 Secondary malignant neoplasm of bone: Secondary | ICD-10-CM | POA: Diagnosis present

## 2018-02-15 DIAGNOSIS — R0603 Acute respiratory distress: Secondary | ICD-10-CM | POA: Diagnosis not present

## 2018-02-15 DIAGNOSIS — I10 Essential (primary) hypertension: Secondary | ICD-10-CM | POA: Diagnosis present

## 2018-02-15 DIAGNOSIS — C73 Malignant neoplasm of thyroid gland: Secondary | ICD-10-CM | POA: Diagnosis present

## 2018-02-15 DIAGNOSIS — J9601 Acute respiratory failure with hypoxia: Secondary | ICD-10-CM | POA: Diagnosis present

## 2018-02-15 DIAGNOSIS — E890 Postprocedural hypothyroidism: Secondary | ICD-10-CM | POA: Diagnosis present

## 2018-02-15 DIAGNOSIS — Z888 Allergy status to other drugs, medicaments and biological substances status: Secondary | ICD-10-CM | POA: Diagnosis not present

## 2018-02-15 DIAGNOSIS — C7802 Secondary malignant neoplasm of left lung: Secondary | ICD-10-CM | POA: Diagnosis present

## 2018-02-15 DIAGNOSIS — E039 Hypothyroidism, unspecified: Secondary | ICD-10-CM | POA: Diagnosis present

## 2018-02-15 DIAGNOSIS — C7801 Secondary malignant neoplasm of right lung: Secondary | ICD-10-CM | POA: Diagnosis present

## 2018-02-15 DIAGNOSIS — Z85850 Personal history of malignant neoplasm of thyroid: Secondary | ICD-10-CM | POA: Diagnosis not present

## 2018-02-15 LAB — CBC
HCT: 28.1 % — ABNORMAL LOW (ref 36.0–46.0)
Hemoglobin: 8.6 g/dL — ABNORMAL LOW (ref 12.0–15.0)
MCH: 26.5 pg (ref 26.0–34.0)
MCHC: 30.6 g/dL (ref 30.0–36.0)
MCV: 86.5 fL (ref 80.0–100.0)
Platelets: 202 10*3/uL (ref 150–400)
RBC: 3.25 MIL/uL — ABNORMAL LOW (ref 3.87–5.11)
RDW: 19.3 % — ABNORMAL HIGH (ref 11.5–15.5)
WBC: 4.7 10*3/uL (ref 4.0–10.5)
nRBC: 0 % (ref 0.0–0.2)

## 2018-02-15 LAB — MAGNESIUM: Magnesium: 1.7 mg/dL (ref 1.7–2.4)

## 2018-02-15 LAB — BASIC METABOLIC PANEL
Anion gap: 16 — ABNORMAL HIGH (ref 5–15)
BUN: 10 mg/dL (ref 6–20)
CO2: 19 mmol/L — AB (ref 22–32)
Calcium: 9 mg/dL (ref 8.9–10.3)
Chloride: 103 mmol/L (ref 98–111)
Creatinine, Ser: 0.58 mg/dL (ref 0.44–1.00)
GFR calc Af Amer: >60 mL/min (ref >60)
GFR calc non Af Amer: >60 mL/min (ref >60)
Glucose, Bld: 64 mg/dL — ABNORMAL LOW (ref 70–99)
Potassium: 4.2 mmol/L (ref 3.5–5.1)
Sodium: 138 mmol/L (ref 135–145)

## 2018-02-15 LAB — STREP PNEUMONIAE URINARY ANTIGEN: Strep Pneumo Urinary Antigen: NEGATIVE

## 2018-02-15 MED ORDER — IPRATROPIUM-ALBUTEROL 0.5-2.5 (3) MG/3ML IN SOLN
3.0000 mL | Freq: Four times a day (QID) | RESPIRATORY_TRACT | Status: DC | PRN
  Administered 2018-02-15 (×2): 3 mL via RESPIRATORY_TRACT
  Filled 2018-02-15 (×2): qty 3

## 2018-02-15 MED ORDER — SODIUM CHLORIDE 0.9 % IV SOLN: INTRAVENOUS | Status: AC

## 2018-02-15 MED ORDER — SODIUM CHLORIDE 0.9 % IV SOLN
INTRAVENOUS | Status: DC
  Administered 2018-02-15: 02:00:00 via INTRAVENOUS

## 2018-02-15 MED ORDER — ENOXAPARIN SODIUM 60 MG/0.6ML ~~LOC~~ SOLN
50.0000 mg | Freq: Two times a day (BID) | SUBCUTANEOUS | Status: DC
  Administered 2018-02-15 – 2018-02-16 (×4): 50 mg via SUBCUTANEOUS
  Filled 2018-02-15 (×2): qty 0.5
  Filled 2018-02-15 (×2): qty 0.6

## 2018-02-15 MED ORDER — BISACODYL 5 MG PO TBEC
5.0000 mg | DELAYED_RELEASE_TABLET | Freq: Every day | ORAL | Status: DC | PRN
  Administered 2018-02-16 (×2): 5 mg via ORAL
  Filled 2018-02-15 (×2): qty 1

## 2018-02-15 MED ORDER — DIPHENHYDRAMINE-ZINC ACETATE 2-0.1 % EX CREA
TOPICAL_CREAM | Freq: Two times a day (BID) | CUTANEOUS | Status: DC | PRN
  Filled 2018-02-15: qty 28

## 2018-02-15 MED ORDER — IPRATROPIUM-ALBUTEROL 0.5-2.5 (3) MG/3ML IN SOLN
3.0000 mL | Freq: Four times a day (QID) | RESPIRATORY_TRACT | Status: DC
  Administered 2018-02-15 – 2018-02-18 (×12): 3 mL via RESPIRATORY_TRACT
  Filled 2018-02-15 (×12): qty 3

## 2018-02-15 NOTE — ED Notes (Signed)
ED TO INPATIENT HANDOFF REPORT  Name/Age/Gender Kendra Mendez 47 y.o. female  Code Status    Code Status Orders  (From admission, onward)         Start     Ordered   02/14/18 2321  Full code  Continuous     02/14/18 2324        Code Status History    Date Active Date Inactive Code Status Order ID Comments User Context   04/23/2012 0647 04/25/2012 0111 Full Code [id number]  Orvan Falconer, MD Inpatient      Home/SNF/Other Home  Chief Complaint difficulty breathing  Level of Care/Admitting Diagnosis ED Disposition    ED Disposition Condition [facility] Area: [facility] [[id number]]  Level of Care: Telemetry [5]  Admit to tele based on following criteria: Monitor for Ischemic changes  Diagnosis: Respiratory distress [387564]  Admitting Physician: Kinnie Feil [[id number]]  Attending Physician: Kinnie Feil [[id number]]  Estimated length of stay: past midnight tomorrow  Certification:: I certify this patient will need inpatient services for at least 2 midnights  PT Class (Do Not Modify): Inpatient [101]  PT Acc Code (Do Not Modify): Private [1]       Medical History Past Medical History:  Diagnosis Date  . 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 Thyroid disease     Allergies Allergies  Allergen Reactions  . Iohexol Hives, Itching and Swelling    Patient is allergic to IV dye and has pre-meds and still allergic to dye;so she was told not to ever have it again.  ak  . Shellfish Allergy Anaphylaxis  . Ace Inhibitors Cough    IV Location/Drains/Wounds Patient Lines/Drains/Airways Status   Active Line/Drains/Airways    Name:   Placement date:   Placement time:   Site:   Days:   Peripheral IV 04/23/12 Right Antecubital   04/23/12    -    Antecubital   2124          Labs/Imaging Results for orders placed or performed during the hospital encounter of 02/14/18 (from the past 48 hour(s))  Comprehensive  metabolic panel     Status: Abnormal   Collection Time: 02/14/18  3:57 PM  Result Value Ref Range   Sodium 140 135 - 145 mmol/L   Potassium 3.9 3.5 - 5.1 mmol/L   Chloride 102 98 - 111 mmol/L   CO2 20 (L) 22 - 32 mmol/L   Glucose, Bld 75 70 - 99 mg/dL   BUN 13 6 - 20 mg/dL   Creatinine, Ser 0.40 (L) 0.44 - 1.00 mg/dL   Calcium 9.8 8.9 - 10.3 mg/dL   Total Protein 7.7 6.5 - 8.1 g/dL   Albumin 4.2 3.5 - 5.0 g/dL   AST 30 15 - 41 U/L   ALT 13 0 - 44 U/L   Alkaline Phosphatase 70 38 - 126 U/L   Total Bilirubin 0.8 0.3 - 1.2 mg/dL   GFR calc non Af Amer >60 >60 mL/min   GFR calc Af Amer >60 >60 mL/min   Anion gap 18 (H) 5 - 15    Comment: Performed at [facility], [place] [address], [place], [place] [address]  CBC with Differential     Status: Abnormal   Collection Time: 02/14/18  3:57 PM  Result Value Ref Range   WBC 4.5 4.0 - 10.5 K/uL   RBC 3.60 (L) 3.87 - 5.11 MIL/uL  Hemoglobin 9.8 (L) 12.0 - 15.0 g/dL   HCT 31.0 (L) 36.0 - 46.0 %   MCV 86.1 80.0 - 100.0 fL   MCH 27.2 26.0 - 34.0 pg   MCHC 31.6 30.0 - 36.0 g/dL   RDW 19.1 (H) 11.5 - 15.5 %   Platelets 210 150 - 400 K/uL   nRBC 0.0 0.0 - 0.2 %   Neutrophils Relative % 67 %   Neutro Abs 3.0 1.7 - 7.7 K/uL   Lymphocytes Relative 18 %   Lymphs Abs 0.8 0.7 - 4.0 K/uL   Monocytes Relative 14 %   Monocytes Absolute 0.6 0.1 - 1.0 K/uL   Eosinophils Relative 1 %   Eosinophils Absolute 0.0 0.0 - 0.5 K/uL   Basophils Relative 0 %   Basophils Absolute 0.0 0.0 - 0.1 K/uL   Immature Granulocytes 0 %   Abs Immature Granulocytes 0.02 0.00 - 0.07 K/uL    Comment: Performed at [facility], [place] [address] [place], [place] [address]  I-Stat beta hCG blood, ED     Status: None   Collection Time: 02/14/18  4:26 PM  Result Value Ref Range   I-stat hCG, quantitative <5.0 <5 mIU/mL   Comment 3            Comment:   GEST. AGE      CONC.  (mIU/mL)   <=1 WEEK        5 - 50     2 WEEKS       50 - 500      3 WEEKS       100 - 10,000     4 WEEKS     1,000 - 30,000        FEMALE AND NON-PREGNANT FEMALE:     LESS THAN 5 mIU/mL   POCT i-Stat troponin I     Status: None   Collection Time: 02/14/18  4:27 PM  Result Value Ref Range   Troponin i, poc 0.00 0.00 - 0.08 ng/mL   Comment 3            Comment: Due to the release kinetics of cTnI, a negative result within the first hours of the onset of symptoms does not rule out myocardial infarction with certainty. If myocardial infarction is still suspected, repeat the test at appropriate intervals.   Strep pneumoniae urinary antigen     Status: None   Collection Time: 02/15/18  1:24 AM  Result Value Ref Range   Strep Pneumo Urinary Antigen NEGATIVE NEGATIVE    Comment:        Infection due to S. pneumoniae cannot be absolutely ruled out since the antigen present may be below the detection limit of the test. PERFORMED AT [facility] Performed at [facility], [place] [address], [place],  [address]   CBC     Status: Abnormal   Collection Time: 02/15/18  7:32 AM  Result Value Ref Range   WBC 4.7 4.0 - 10.5 K/uL   RBC 3.25 (L) 3.87 - 5.11 MIL/uL   Hemoglobin 8.6 (L) 12.0 - 15.0 g/dL   HCT 28.1 (L) 36.0 - 46.0 %   MCV 86.5 80.0 - 100.0 fL   MCH 26.5 26.0 - 34.0 pg   MCHC 30.6 30.0 - 36.0 g/dL   RDW 19.3 (H) 11.5 - 15.5 %   Platelets 202 150 - 400 K/uL   nRBC 0.0 0.0 - 0.2 %    Comment: Performed at [facility]  [place] [address], [place], [place] [address]  Basic metabolic panel     Status: Abnormal   Collection Time: 02/15/18  7:32 AM  Result Value Ref Range   Sodium 138 135 - 145 mmol/L   Potassium 4.2 3.5 - 5.1 mmol/L   Chloride 103 98 - 111 mmol/L   CO2 19 (L) 22 - 32 mmol/L   Glucose, Bld 64 (L) 70 - 99 mg/dL   BUN 10 6 - 20 mg/dL   Creatinine, Ser 0.58 0.44 - 1.00 mg/dL   Calcium 9.0 8.9 - 10.3 mg/dL   GFR calc non Af Amer >60 >60 mL/min   GFR calc Af Amer >60 >60 mL/min   Anion gap 16  (H) 5 - 15    Comment: Performed at [facility], [place] [address] [place], [place] [address]  Magnesium     Status: None   Collection Time: 02/15/18  7:32 AM  Result Value Ref Range   Magnesium 1.7 1.7 - 2.4 mg/dL    Comment: Performed at [facility], [place] [address] [place], [place] [address]   Dg Chest 2 View  Result Date: 02/14/2018 CLINICAL DATA:  Shortness of breath. Metastatic thyroid cancer. EXAM: CHEST - 2 VIEW COMPARISON:  Chest CT dated 04/24/2012 and chest x-ray dated 04/23/2012 FINDINGS: There has been a marked increase in the size and number of the metastases throughout both lungs. Heart size and pulmonary vascularity are normal. There are multiple progressive bone metastases. No effusions. New compression fracture of T10. Increased destruction of the distal right clavicle. New lytic lesions in the distal left clavicle and left scapula. Multiple rib metastases are new. IMPRESSION: 1. Marked progression of diffuse bilateral pulmonary metastases. 2. New compression fracture of T10 likely pathologic. 3. Progressive bone metastases. Electronically Signed   By: Lorriane Shire M.D.   On: 02/14/2018 17:22   Nm Pulmonary Perfusion  Result Date: 02/14/2018 CLINICAL DATA:  Shortness of breath and cough with known history of multiple lung metastatic lesions EXAM: NUCLEAR MEDICINE PERFUSION LUNG SCAN TECHNIQUE: Perfusion images were obtained in multiple projections after intravenous injection of radiopharmaceutical. RADIOPHARMACEUTICALS:  4.4 mCi Tc-83m MAA IV COMPARISON:  Chest x-ray from earlier in the same day. FINDINGS: Perfusion images demonstrate multiple rounded defects throughout both lungs which would correspond with the large metastatic lesion seen on recent chest x-ray. Suspicious for wedge-shaped defects are noted on the right laterally as well as on the left lung in the LPO image but given the significant metastatic disease they are not definitive  for emboli without a ventilation series. IMPRESSION: Indeterminate scan for pulmonary embolism due to significant defects related to the metastatic disease. CT with premedication could possibly be performed depending on the severity of prior allergic reaction. Electronically Signed   By: Inez Catalina M.D.   On: 02/14/2018 21:29   EKG Interpretation  Date/Time:  Monday February 14 2018 14:48:44 EST Ventricular Rate:  104 PR Interval:    QRS Duration: 106 QT Interval:  350 QTC Calculation: 465 R Axis:   78 Text Interpretation:  Sinus tachycardia Low voltage, precordial leads Borderline repolarization abnormality Minimal ST elevation, inferior leads Confirmed by Davonna Belling [phone]) on 02/14/2018 8:36:53 PM   Pending Labs Unresulted Labs (From admission, onward)    Start     Ordered   02/18/18 0500  CBC  Every 72 hours,   R     02/15/18 0008   02/14/18 2351  Culture, sputum-assessment  Once,  R    Question:  Patient immune status  Answer:  Immunocompromised   02/14/18 2350   02/14/18 2351  Gram stain  Once,   R    Question:  Patient immune status  Answer:  Immunocompromised   02/14/18 2350   02/14/18 2351  Legionella Pneumophila Serogp 1 Ur Ag  Once,   R     02/14/18 2350          Vitals/Pain Today's Vitals   02/15/18 1015 02/15/18 1030 02/15/18 1100 02/15/18 1130  BP:  100/67 102/67 98/64  Pulse: (!) 102 (!) 102 (!) 102   Resp: (!) 23 (!) 24 (!) 24 (!) 27  Temp:      TempSrc:      SpO2: 97% 96% 96%   Weight:      Height:      PainSc:        Isolation Precautions No active isolations  Medications Medications  iopamidol (ISOVUE-300) 61 % injection (  Canceled Entry 02/14/18 1918)  amLODipine (NORVASC) tablet 10 mg (10 mg Oral Given 02/15/18 0932)  gabapentin (NEURONTIN) capsule 100 mg (100 mg Oral Given 02/15/18 0933)  levothyroxine (SYNTHROID, LEVOTHROID) tablet 175 mcg (175 mcg Oral Given 02/15/18 0727)  sodium chloride flush (NS) 0.9 % injection 3 mL (3 mLs  Intravenous Given 02/15/18 0932)  acetaminophen (TYLENOL) tablet 650 mg (has no administration in time range)    Or  acetaminophen (TYLENOL) suppository 650 mg (has no administration in time range)  ondansetron (ZOFRAN) tablet 4 mg (has no administration in time range)    Or  ondansetron (ZOFRAN) injection 4 mg (has no administration in time range)  ipratropium-albuterol (DUONEB) 0.5-2.5 (3) MG/3ML nebulizer solution 3 mL (has no administration in time range)  morphine 2 MG/ML injection 2 mg (2 mg Intravenous Given 02/15/18 0138)  oxyCODONE (Oxy IR/ROXICODONE) immediate release tablet 5 mg (has no administration in time range)  metoprolol succinate (TOPROL-XL) 24 hr tablet 25 mg (25 mg Oral Given 02/15/18 0931)  cefTRIAXone (ROCEPHIN) 1 g in sodium chloride 0.9 % 100 mL IVPB ( Intravenous Stopped 02/15/18 0223)  azithromycin (ZITHROMAX) 500 mg in sodium chloride 0.9 % 250 mL IVPB ( Intravenous Stopped 02/15/18 0520)  0.9 %  sodium chloride infusion ( Intravenous New Bag/Given 02/15/18 0141)  enoxaparin (LOVENOX) injection 50 mg (50 mg Subcutaneous Given 02/15/18 0932)  albuterol (PROVENTIL) (2.5 MG/3ML) 0.083% nebulizer solution 5 mg (5 mg Nebulization Given 02/14/18 1449)  ipratropium-albuterol (DUONEB) 0.5-2.5 (3) MG/3ML nebulizer solution 3 mL (3 mLs Nebulization Given 02/14/18 1614)  fentaNYL (SUBLIMAZE) injection 25 mcg (25 mcg Intravenous Given 02/14/18 1616)  chlorpheniramine-HYDROcodone (TUSSIONEX) 10-8 MG/5ML suspension 5 mL (5 mLs Oral Given 02/14/18 1743)  sodium chloride 0.9 % bolus 1,000 mL (0 mLs Intravenous Stopped 02/14/18 1900)  LORazepam (ATIVAN) injection 0.5 mg (0.5 mg Intravenous Given 02/14/18 1804)  LORazepam (ATIVAN) injection 0.5 mg (0.5 mg Intravenous Given 02/14/18 1935)  technetium TC 57M diethylenetriame-pentaacetic acid (DTPA) injection 93.2 millicurie (35.5 millicuries Inhalation Given 02/14/18 1923)  technetium albumin aggregated (MAA) injection solution 4.4  millicurie (4.4 millicuries Intravenous Contrast Given 02/14/18 2040)    Mobility walks

## 2018-02-15 NOTE — Progress Notes (Signed)
*  Preliminary Results* Bilateral lower extremity venous duplex completed. Bilateral lower extremities are negative for deep vein thrombosis. There is no evidence of Baker's cyst bilaterally.  02/15/2018 2:58 PM Kendra Mendez Dawna Part

## 2018-02-15 NOTE — Progress Notes (Signed)
Pt complained of IV burning up her arm. Pt still had tourniquet from IV start.

## 2018-02-15 NOTE — Progress Notes (Signed)
[facility] PROGRESS NOTE  Kendra Mendez:[id number] DOB: 1970/10/09 DOA: 02/14/2018 PCP: System, Provider Not In  Brief summary   47 y.o. female with medical history significant of metastatic thyroid cancer (s/p thyroidectomy) extensive pulmonary mets, mets to bones (T spinal vertebral mets, pelvic mets), underwent chemotherapy, recent bone radiation treatment,  who presents with complaints of worsening shortness of breath, associated with productive cough, respiratory distress  ED Course: Pulse 97-117, respirations 19-43, and O2 saturations 94 to 100% on 2 L nasal cannula oxygen.  Patient was never noted to be hypoxic.  Labs revealed WBC 4.5, hemoglobin 9.8, CO2 20, anion gap 18, and troponin 0.  Chest x-ray sharp progression of pulmonary mets with new compression at T10.  Due to patient's contrast allergy a VQ scan was attempted, but inconclusive of the possibility of a pulmonary embolus.  Patient had been given 1 L of normal saline IV fluids, DuoNeb breathing treatments, 25 mcg of fentanyl for pain, and Tussionex without relief of symptoms.  [facility] called to admit due to difficulty breathing.  Assessment/Plan:  Community-acquired pneumonia: +worsening productive cough and shortness of breath.  + immunocompromised patient, susceptible for pneumonia. Unlikely PE. VQ was inconclusive due to extensive metastatic disease. Patient is nto able to tolerate IV contrast even with steroid prep in the past.  -cont empiric antibiotics of ceftriaxone and azithromycin (12/17<), bronchodilators, oxygen. will obtain doppler legs.  -we will obtain ct chest WO contrast for better evaluation   Metastatic thyroid cancer (s/p thyroidectomy) extensive pulmonary mets, mets to bones (T spinal vertebral mets, pelvic mets), underwent chemotherapy, recent bone radiation treatment, chemo therapy, and palliative radiation.  cont pain control, f/u with oncology as OP  Essential hypertension. Chronic tachycardia.  Hold amlodipine due to soft BP. Monitor, Continue metoprolol   Hypothyroidism (s/p thyroidectomy). Resume home regimen. Goal tsh<0.1  Normocytic normochromic anemia:No reports of bleeding. Monitor   Prognosis is guarded with extensive metastatic cancer.   Code Status: full Family Communication: d/w patient, her family,  (indicate person spoken with, relationship, and if by phone, the number) Disposition Plan: remains inpatient    Consultants:  none  Procedures:  Ct chest   Antibiotics: Anti-infectives (From admission, onward)   Start     Dose/Rate Route Frequency Ordered Stop   02/15/18 0000  cefTRIAXone (ROCEPHIN) 1 g in sodium chloride 0.9 % 100 mL IVPB     1 g 200 mL/hr over 30 Minutes Intravenous Daily at bedtime 02/14/18 2350 02/21/18 2159   02/15/18 0000  azithromycin (ZITHROMAX) 500 mg in sodium chloride 0.9 % 250 mL IVPB     500 mg 250 mL/hr over 60 Minutes Intravenous Daily at bedtime 02/14/18 2350 02/21/18 2159        (indicate start date, and stop date if known)  HPI/Subjective: Patient reports productive cough, dyspnea. No acute chest pains. She felt better after bronchodilators Rx.   Objective: Vitals:   02/15/18 1100 02/15/18 1130  BP: 102/67 98/64  Pulse: (!) 102   Resp: (!) 24 (!) 27  Temp:    SpO2: 96%     Intake/Output Summary (Last 24 hours) at 02/15/2018 1332 Last data filed at 02/15/2018 0705 Gross per 24 hour  Intake 1371.02 ml  Output -  Net 1371.02 ml   Filed Weights   02/14/18 2341  Weight: 47.6 kg    Exam:   General:  Mild distress respiratory   Cardiovascular: s1,s2 tachycardia   Respiratory: wheezing, rales   Abdomen: soft, nt   Musculoskeletal: no  pedal edema    Data Reviewed: Basic Metabolic Panel: Recent Labs  Lab 02/14/18 1557 02/15/18 0732  NA 140 138  K 3.9 4.2  CL 102 103  CO2 20* 19*  GLUCOSE 75 64*  BUN 13 10  CREATININE 0.40* 0.58  CALCIUM 9.8 9.0  MG  --  1.7   Liver Function  Tests: Recent Labs  Lab 02/14/18 1557  AST 30  ALT 13  ALKPHOS 70  BILITOT 0.8  PROT 7.7  ALBUMIN 4.2   No results for input(s): LIPASE, AMYLASE in the last 168 hours. No results for input(s): AMMONIA in the last 168 hours. CBC: Recent Labs  Lab 02/14/18 1557 02/15/18 0732  WBC 4.5 4.7  NEUTROABS 3.0  --   HGB 9.8* 8.6*  HCT 31.0* 28.1*  MCV 86.1 86.5  PLT 210 202   Cardiac Enzymes: No results for input(s): CKTOTAL, CKMB, CKMBINDEX, TROPONINI in the last 168 hours. BNP (last 3 results) No results for input(s): BNP in the last 8760 hours.  ProBNP (last 3 results) No results for input(s): PROBNP in the last 8760 hours.  CBG: No results for input(s): GLUCAP in the last 168 hours.  No results found for this or any previous visit (from the past 240 hour(s)).   Studies: Dg Chest 2 View  Result Date: 02/14/2018 CLINICAL DATA:  Shortness of breath. Metastatic thyroid cancer. EXAM: CHEST - 2 VIEW COMPARISON:  Chest CT dated 04/24/2012 and chest x-ray dated 04/23/2012 FINDINGS: There has been a marked increase in the size and number of the metastases throughout both lungs. Heart size and pulmonary vascularity are normal. There are multiple progressive bone metastases. No effusions. New compression fracture of T10. Increased destruction of the distal right clavicle. New lytic lesions in the distal left clavicle and left scapula. Multiple rib metastases are new. IMPRESSION: 1. Marked progression of diffuse bilateral pulmonary metastases. 2. New compression fracture of T10 likely pathologic. 3. Progressive bone metastases. Electronically Signed   By: Lorriane Shire M.D.   On: 02/14/2018 17:22   Nm Pulmonary Perfusion  Result Date: 02/14/2018 CLINICAL DATA:  Shortness of breath and cough with known history of multiple lung metastatic lesions EXAM: NUCLEAR MEDICINE PERFUSION LUNG SCAN TECHNIQUE: Perfusion images were obtained in multiple projections after intravenous injection of  radiopharmaceutical. RADIOPHARMACEUTICALS:  4.4 mCi Tc-16m MAA IV COMPARISON:  Chest x-ray from earlier in the same day. FINDINGS: Perfusion images demonstrate multiple rounded defects throughout both lungs which would correspond with the large metastatic lesion seen on recent chest x-ray. Suspicious for wedge-shaped defects are noted on the right laterally as well as on the left lung in the LPO image but given the significant metastatic disease they are not definitive for emboli without a ventilation series. IMPRESSION: Indeterminate scan for pulmonary embolism due to significant defects related to the metastatic disease. CT with premedication could possibly be performed depending on the severity of prior allergic reaction. Electronically Signed   By: Inez Catalina M.D.   On: 02/14/2018 21:29    Scheduled Meds: . amLODipine  10 mg Oral q morning - 10a  . enoxaparin (LOVENOX) injection  50 mg Subcutaneous BID  . gabapentin  100 mg Oral TID  . levothyroxine  175 mcg Oral Q0600  . metoprolol succinate  25 mg Oral Daily  . sodium chloride flush  3 mL Intravenous Q12H   Continuous Infusions: . sodium chloride 75 mL/hr at 02/15/18 0141  . azithromycin Stopped (02/15/18 0520)  . 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 If 7PM-7AM, please contact night-coverage at [email or web address], password [facility] 02/15/2018, 1:32 PM  LOS: 0 days

## 2018-02-15 NOTE — Progress Notes (Signed)
ANTICOAGULATION CONSULT NOTE - Initial Consult  Pharmacy Consult for Enoxaparin Indication: VTE Treatment  Allergies  Allergen Reactions  . Iohexol Hives, Itching and Swelling    Patient is allergic to IV dye and has pre-meds and still allergic to dye;so she was told not to ever have it again.  ak  . Shellfish Allergy Anaphylaxis  . Ace Inhibitors Cough    Patient Measurements: Height: 4\' 11"  (149.9 cm) Weight: 105 lb (47.6 kg) IBW/kg (Calculated) : 43.2 Heparin Dosing Weight:   Vital Signs: Temp: 98.2 F (36.8 C) (12/16 1437) Temp Source: Oral (12/16 1437) BP: 110/65 (12/16 2230) Pulse Rate: 112 (12/16 2230)  Labs: Recent Labs    02/14/18 1557  HGB 9.8*  HCT 31.0*  PLT 210  CREATININE 0.40*    Estimated Creatinine Clearance: 58 mL/min (A) (by C-G formula based on SCr of 0.4 mg/dL (L)).   Medical History: Past Medical History:  Diagnosis Date  . Thyroid cancer ([place])    Metastatic to lungs and bone  . Thyroid disease     Medications:  (Not in a hospital admission)  Scheduled:  . amLODipine  10 mg Oral q morning - 10a  . enoxaparin (LOVENOX) injection  50 mg Subcutaneous BID  . gabapentin  100 mg Oral TID  . iopamidol      . levothyroxine  175 mcg Oral Q0600  . metoprolol succinate  25 mg Oral Daily  . sodium chloride (PF)      . sodium chloride flush  3 mL Intravenous Q12H    Assessment: Patient with cancer and MD wishes for pharmacy to dose enoxaparin for VTE treatment.  No oral anticoagulants noted on med rec.   Goal of Therapy:  Anti-Xa level 0.6-1 units/ml 4hrs after LMWH dose given Monitor platelets by anticoagulation protocol: Yes   Plan:  Enoxaparin 50mg  sq 12hr CBC q 3days  Nani Skillern Crowford 02/15/2018,12:06 AM

## 2018-02-15 NOTE — Plan of Care (Signed)
Pt is advancing towards d/c

## 2018-02-16 LAB — LEGIONELLA PNEUMOPHILA SEROGP 1 UR AG: L. pneumophila Serogp 1 Ur Ag: NEGATIVE

## 2018-02-16 MED ORDER — METHYLPREDNISOLONE SODIUM SUCC 40 MG IJ SOLR
40.0000 mg | Freq: Two times a day (BID) | INTRAMUSCULAR | Status: DC
  Administered 2018-02-16 – 2018-02-18 (×5): 40 mg via INTRAVENOUS
  Filled 2018-02-16 (×5): qty 1

## 2018-02-16 MED ORDER — ENOXAPARIN SODIUM 40 MG/0.4ML ~~LOC~~ SOLN
40.0000 mg | SUBCUTANEOUS | Status: DC
  Administered 2018-02-18: 40 mg via SUBCUTANEOUS
  Filled 2018-02-16 (×2): qty 0.4

## 2018-02-16 MED ORDER — GUAIFENESIN-DM 100-10 MG/5ML PO SYRP
5.0000 mL | ORAL_SOLUTION | ORAL | Status: DC | PRN
  Administered 2018-02-16 – 2018-02-17 (×2): 5 mL via ORAL
  Filled 2018-02-16 (×2): qty 10

## 2018-02-16 NOTE — Evaluation (Signed)
Physical Therapy One Time Evaluation Patient Details Name: Kendra Mendez MRN: [id number] DOB: 21-Jul-1970 Today's Date: 02/16/2018   History of Present Illness  47 y.o. female with medical history significant of metastatic thyroid cancer (s/p thyroidectomy) extensive pulmonary mets, mets to bones (T spinal vertebral mets, pelvic mets), underwent chemotherapy, recent bone radiation treatment,  who presents with complaints of worsening shortness of breath, associated with productive cough, respiratory distress and diagnosed with CAP.  Clinical Impression  Patient evaluated by Physical Therapy with no further acute PT needs identified. All education has been completed and the patient has no further questions.  Pt ambulated in hallway good distance and reports mild SOB however also maintained 3L O2 [place].  Pt reports she is not on oxygen at home.  Pt encouraged to ambulate with staff/family during acute stay, and she is agreeable. See below for any follow-up Physical Therapy or equipment needs. PT is signing off. Thank you for this referral.    Follow Up Recommendations No PT follow up    Equipment Recommendations  None recommended by PT    Recommendations for Other Services       Precautions / Restrictions Precautions Precaution Comments: currently on 3L O2 [place]      Mobility  Bed Mobility Overal bed mobility: Independent                Transfers Overall transfer level: Modified independent                  Ambulation/Gait Ambulation/Gait assistance: Supervision Gait Distance (Feet): 340 Feet Assistive device: IV Pole Gait Pattern/deviations: Step-through pattern     General Gait Details: pt pushed IV pole however appears steady, a little SOB during ambulation, SpO2 92% on 3L O2 [place] and HR 116 bpm  Stairs            Wheelchair Mobility    Modified Rankin (Stroke Patients Only)       Balance Overall balance assessment: No apparent balance deficits (not  formally assessed)                                           Pertinent Vitals/Pain Pain Assessment: No/denies pain    Home Living Family/patient expects to be discharged to:: Private residence Living Arrangements: Children;Other relatives(daughter, son in law, grandkids) Available Help at Discharge: Family   Home Access: Level entry     Home Layout: One level Home Equipment: None      Prior Function Level of Independence: Independent               Hand Dominance        Extremity/Trunk Assessment   Upper Extremity Assessment Upper Extremity Assessment: Overall WFL for tasks assessed    Lower Extremity Assessment Lower Extremity Assessment: Overall WFL for tasks assessed    Cervical / Trunk Assessment Cervical / Trunk Assessment: Normal  Communication   Communication: No difficulties  Cognition Arousal/Alertness: Awake/alert Behavior During Therapy: WFL for tasks assessed/performed Overall Cognitive Status: Within Functional Limits for tasks assessed                                        General Comments      Exercises     Assessment/Plan    PT Assessment Patent does not need  any further PT services  PT Problem List         PT Treatment Interventions      PT Goals (Current goals can be found in the Care Plan section)  Acute Rehab PT Goals PT Goal Formulation: All assessment and education complete, DC therapy    Frequency     Barriers to discharge        Co-evaluation               AM-PAC PT "6 Clicks" Mobility  Outcome Measure Help needed turning from your back to your side while in a flat bed without using bedrails?: None Help needed moving from lying on your back to sitting on the side of a flat bed without using bedrails?: None Help needed moving to and from a bed to a chair (including a wheelchair)?: None Help needed standing up from a chair using your arms (e.g., wheelchair or bedside  chair)?: A Little Help needed to walk in hospital room?: A Little Help needed climbing 3-5 steps with a railing? : A Little 6 Click Score: 21    End of Session Equipment Utilized During Treatment: Oxygen Activity Tolerance: Patient tolerated treatment well Patient left: in chair;with call bell/phone within reach;with nursing/sitter in room Nurse Communication: Mobility status PT Visit Diagnosis: Difficulty in walking, not elsewhere classified (R26.2)    Time: 4827-0786 PT Time Calculation (min) (ACUTE ONLY): 17 min   Charges:   PT Evaluation $PT Eval Low Complexity: [place], PT, DPT Acute Rehabilitation Services Office: [phone] Pager: [phone]  Trena Platt 02/16/2018, 4:27 PM

## 2018-02-16 NOTE — Progress Notes (Signed)
[facility] PROGRESS NOTE  PENNEY DOMANSKI JKD:[id number] DOB: 1971/02/24 DOA: 02/14/2018 PCP: System, Provider Not In  Brief summary   47 y.o. female with medical history significant of metastatic thyroid cancer (s/p thyroidectomy) extensive pulmonary mets, mets to bones (T spinal vertebral mets, pelvic mets), underwent chemotherapy, recent bone radiation treatment,  who presents with complaints of worsening shortness of breath, associated with productive cough, respiratory distress  ED Course: Pulse 97-117, respirations 19-43, and O2 saturations 94 to 100% on 2 L nasal cannula oxygen.  Patient was never noted to be hypoxic.  Labs revealed WBC 4.5, hemoglobin 9.8, CO2 20, anion gap 18, and troponin 0.  Chest x-ray sharp progression of pulmonary mets with new compression at T10.  Due to patient's contrast allergy a VQ scan was attempted, but inconclusive of the possibility of a pulmonary embolus.  Patient had been given 1 L of normal saline IV fluids, DuoNeb breathing treatments, 25 mcg of fentanyl for pain, and Tussionex without relief of symptoms.  [facility] called to admit due to difficulty breathing.  Assessment/Plan:  Community-acquired pneumonia: +worsening productive cough and shortness of breath.  + immunocompromised patient, susceptible for pneumonia. Unlikely PE. VQ was inconclusive due to extensive metastatic disease. Patient is not able to tolerate IV contrast even with steroid prep in the past.  Doppler leg: No DVT.  -still rales, wheezing  on exam. cont empiric antibiotics of ceftriaxone and azithromycin (12/17<), bronchodilators, oxygen. Add steroids. Monitor   Metastatic thyroid cancer (s/p thyroidectomy) extensive pulmonary mets, mets to bones (T spinal vertebral mets, pelvic mets), underwent chemotherapy, recent bone radiation treatment, chemo therapy, and palliative radiation.  cont pain control, f/u with oncology as OP  Essential hypertension. Chronic tachycardia. Hold  amlodipine due to soft BP. Monitor, Continue metoprolol   Hypothyroidism (s/p thyroidectomy). Resume home regimen. Goal tsh<0.1  Normocytic normochromic anemia:No reports of bleeding. Monitor   Prognosis is guarded with extensive metastatic cancer.   Code Status: full Family Communication: d/w patient, her family,  (indicate person spoken with, relationship, and if by phone, the number) Disposition Plan: remains inpatient    Consultants:  none  Procedures:  Ct chest   Antibiotics: Anti-infectives (From admission, onward)   Start     Dose/Rate Route Frequency Ordered Stop   02/15/18 0000  cefTRIAXone (ROCEPHIN) 1 g in sodium chloride 0.9 % 100 mL IVPB     1 g 200 mL/hr over 30 Minutes Intravenous Daily at bedtime 02/14/18 2350 02/21/18 2159   02/15/18 0000  azithromycin (ZITHROMAX) 500 mg in sodium chloride 0.9 % 250 mL IVPB     500 mg 250 mL/hr over 60 Minutes Intravenous Daily at bedtime 02/14/18 2350 02/21/18 2159       (indicate start date, and stop date if known)  HPI/Subjective: +cough, productive, rales on exam, wheezing.    Objective: Vitals:   02/16/18 0551 02/16/18 1007  BP: 99/67   Pulse: (!) 101   Resp: 14   Temp: 98 F (36.7 C)   SpO2: 98% 94%    Intake/Output Summary (Last 24 hours) at 02/16/2018 1012 Last data filed at 02/16/2018 0998 Gross per 24 hour  Intake 2162.9 ml  Output -  Net 2162.9 ml   Filed Weights   02/14/18 2341  Weight: 47.6 kg    Exam:   General:  Mild distress respiratory   Cardiovascular: s1,s2 tachycardia   Respiratory: wheezing, rales   Abdomen: soft, nt   Musculoskeletal: no pedal edema    Data Reviewed: Basic Metabolic  Panel: Recent Labs  Lab 02/14/18 1557 02/15/18 0732  NA 140 138  K 3.9 4.2  CL 102 103  CO2 20* 19*  GLUCOSE 75 64*  BUN 13 10  CREATININE 0.40* 0.58  CALCIUM 9.8 9.0  MG  --  1.7   Liver Function Tests: Recent Labs  Lab 02/14/18 1557  AST 30  ALT 13  ALKPHOS 70   BILITOT 0.8  PROT 7.7  ALBUMIN 4.2   No results for input(s): LIPASE, AMYLASE in the last 168 hours. No results for input(s): AMMONIA in the last 168 hours. CBC: Recent Labs  Lab 02/14/18 1557 02/15/18 0732  WBC 4.5 4.7  NEUTROABS 3.0  --   HGB 9.8* 8.6*  HCT 31.0* 28.1*  MCV 86.1 86.5  PLT 210 202   Cardiac Enzymes: No results for input(s): CKTOTAL, CKMB, CKMBINDEX, TROPONINI in the last 168 hours. BNP (last 3 results) No results for input(s): BNP in the last 8760 hours.  ProBNP (last 3 results) No results for input(s): PROBNP in the last 8760 hours.  CBG: No results for input(s): GLUCAP in the last 168 hours.  No results found for this or any previous visit (from the past 240 hour(s)).   Studies: Dg Chest 2 View  Result Date: 02/14/2018 CLINICAL DATA:  Shortness of breath. Metastatic thyroid cancer. EXAM: CHEST - 2 VIEW COMPARISON:  Chest CT dated 04/24/2012 and chest x-ray dated 04/23/2012 FINDINGS: There has been a marked increase in the size and number of the metastases throughout both lungs. Heart size and pulmonary vascularity are normal. There are multiple progressive bone metastases. No effusions. New compression fracture of T10. Increased destruction of the distal right clavicle. New lytic lesions in the distal left clavicle and left scapula. Multiple rib metastases are new. IMPRESSION: 1. Marked progression of diffuse bilateral pulmonary metastases. 2. New compression fracture of T10 likely pathologic. 3. Progressive bone metastases. Electronically Signed   By: Lorriane Shire M.D.   On: 02/14/2018 17:22   Ct Chest Wo Contrast  Result Date: 02/15/2018 CLINICAL DATA:  Metastatic thyroid cancer.  Shortness of breath. EXAM: CT CHEST WITHOUT CONTRAST TECHNIQUE: Multidetector CT imaging of the chest was performed following the standard protocol without IV contrast. COMPARISON:  Chest CT 04/24/2012 and chest x-ray 02/14/2018 FINDINGS: Cardiovascular: The heart is  borderline enlarged. Small pericardial effusion. The aorta is normal in caliber. Calcifications noted at the aortic arch. Mediastinum/Nodes: Suspect mediastinal and hilar adenopathy. Lungs/Pleura: Diffuse progressive metastatic pulmonary disease with a large "cannon ball" metastasis. Patchy areas of atelectasis but no superimposed infiltrates. Upper Abdomen: No significant upper abdominal findings are identified. Musculoskeletal: Diffuse destructive bony metastatic disease progressive since prior study with extensive involvement of the spine, sternum and ribs. IMPRESSION: 1. Progressive diffuse pulmonary metastatic disease. Patchy areas of atelectasis but no superimposed infiltrates or effusions. 2. Progressive diffuse destructive osseous metastatic disease. Aortic Atherosclerosis (ICD10-I70.0). Electronically Signed   By: Marijo Sanes M.D.   On: 02/15/2018 17:56   Nm Pulmonary Perfusion  Result Date: 02/14/2018 CLINICAL DATA:  Shortness of breath and cough with known history of multiple lung metastatic lesions EXAM: NUCLEAR MEDICINE PERFUSION LUNG SCAN TECHNIQUE: Perfusion images were obtained in multiple projections after intravenous injection of radiopharmaceutical. RADIOPHARMACEUTICALS:  4.4 mCi Tc-53m MAA IV COMPARISON:  Chest x-ray from earlier in the same day. FINDINGS: Perfusion images demonstrate multiple rounded defects throughout both lungs which would correspond with the large metastatic lesion seen on recent chest x-ray. Suspicious for wedge-shaped defects are noted on the  right laterally as well as on the left lung in the LPO image but given the significant metastatic disease they are not definitive for emboli without a ventilation series. IMPRESSION: Indeterminate scan for pulmonary embolism due to significant defects related to the metastatic disease. CT with premedication could possibly be performed depending on the severity of prior allergic reaction. Electronically Signed   By: Inez Catalina  M.D.   On: 02/14/2018 21:29   Vas [place] Lower Extremity Venous (dvt)  Result Date: 02/15/2018  Lower Venous Study Indications: Edema, and Swelling.  Performing Technologist: Abram Sander RVS  Examination Guidelines: A complete evaluation includes B-mode imaging, spectral Doppler, color Doppler, and power Doppler as needed of all accessible portions of each vessel. Bilateral testing is considered an integral part of a complete examination. Limited examinations for reoccurring indications may be performed as noted.  Right Venous Findings: +---------+---------------+---------+-----------+----------+-------+          CompressibilityPhasicitySpontaneityPropertiesSummary +---------+---------------+---------+-----------+----------+-------+ CFV      Full           Yes      Yes                          +---------+---------------+---------+-----------+----------+-------+ SFJ      Full                                                 +---------+---------------+---------+-----------+----------+-------+ FV Prox  Full                                                 +---------+---------------+---------+-----------+----------+-------+ FV Mid   Full                                                 +---------+---------------+---------+-----------+----------+-------+ FV DistalFull                                                 +---------+---------------+---------+-----------+----------+-------+ PFV      Full                                                 +---------+---------------+---------+-----------+----------+-------+ POP      Full           Yes      Yes                          +---------+---------------+---------+-----------+----------+-------+ PTV      Full                                                 +---------+---------------+---------+-----------+----------+-------+ PERO     Full                                                  +---------+---------------+---------+-----------+----------+-------+  Left Venous Findings: +---------+---------------+---------+-----------+----------+-------+          CompressibilityPhasicitySpontaneityPropertiesSummary +---------+---------------+---------+-----------+----------+-------+ CFV      Full           Yes      Yes                          +---------+---------------+---------+-----------+----------+-------+ SFJ      Full                                                 +---------+---------------+---------+-----------+----------+-------+ FV Prox  Full                                                 +---------+---------------+---------+-----------+----------+-------+ FV Mid   Full                                                 +---------+---------------+---------+-----------+----------+-------+ FV DistalFull                                                 +---------+---------------+---------+-----------+----------+-------+ PFV      Full                                                 +---------+---------------+---------+-----------+----------+-------+ POP      Full           Yes      Yes                          +---------+---------------+---------+-----------+----------+-------+ PTV      Full                                                 +---------+---------------+---------+-----------+----------+-------+ PERO     Full                                                 +---------+---------------+---------+-----------+----------+-------+    Summary: Right: There is no evidence of deep vein thrombosis in the lower extremity. No cystic structure found in the popliteal fossa. Left: There is no evidence of deep vein thrombosis in the lower extremity. No cystic structure found in the popliteal fossa.  *See table(s) above for measurements and observations. Electronically signed by Deitra Mayo MD on 02/15/2018 at 4:31:47 PM.    Final      Scheduled Meds: . enoxaparin (LOVENOX) injection  50 mg Subcutaneous BID  . gabapentin  100 mg Oral TID  . ipratropium-albuterol  3 mL Nebulization QID  . levothyroxine  175 mcg Oral Q0600  . metoprolol succinate  25 mg Oral Daily  . sodium chloride flush  3 mL Intravenous Q12H   Continuous Infusions: . sodium chloride 75 mL/hr at 02/15/18 1429  . azithromycin 500 mg (02/15/18 2300)  . 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 If 7PM-7AM, please contact night-coverage at [email or web address], password [facility] 02/16/2018, 10:12 AM  LOS: 1 day

## 2018-02-16 NOTE — Progress Notes (Signed)
   02/16/18 0551  MEWS Score  Resp 14  Pulse Rate (!) 101  BP 99/67  Temp 98 F (36.7 C)  SpO2 98 %  O2 Device Nasal Cannula  O2 Flow Rate (L/min) 3 L/min  MEWS Score  MEWS RR 0  MEWS Pulse 1  MEWS Systolic 1  MEWS LOC 0  MEWS Temp 0  MEWS Score 2  MEWS Score Color Yellow  MEWS Assessment  Is this an acute change? No   Pt remains stable and unchanged. Pt is tachy on the monitor and is being watched.

## 2018-02-17 LAB — CBC
HCT: 29 % — ABNORMAL LOW (ref 36.0–46.0)
Hemoglobin: 8.9 g/dL — ABNORMAL LOW (ref 12.0–15.0)
MCH: 27 pg (ref 26.0–34.0)
MCHC: 30.7 g/dL (ref 30.0–36.0)
MCV: 87.9 fL (ref 80.0–100.0)
Platelets: 192 10*3/uL (ref 150–400)
RBC: 3.3 MIL/uL — ABNORMAL LOW (ref 3.87–5.11)
RDW: 19.6 % — ABNORMAL HIGH (ref 11.5–15.5)
WBC: 3.7 10*3/uL — ABNORMAL LOW (ref 4.0–10.5)
nRBC: 0 % (ref 0.0–0.2)

## 2018-02-17 MED ORDER — POLYETHYLENE GLYCOL 3350 17 G PO PACK
17.0000 g | PACK | Freq: Every day | ORAL | Status: DC
  Administered 2018-02-17 – 2018-02-18 (×3): 17 g via ORAL
  Filled 2018-02-17 (×2): qty 1

## 2018-02-17 MED ORDER — SENNOSIDES-DOCUSATE SODIUM 8.6-50 MG PO TABS
1.0000 | ORAL_TABLET | Freq: Two times a day (BID) | ORAL | Status: DC
  Administered 2018-02-17 – 2018-02-18 (×3): 1 via ORAL
  Filled 2018-02-17 (×3): qty 1

## 2018-02-17 NOTE — Progress Notes (Signed)
[facility] PROGRESS NOTE  Kendra Mendez WCB:[id number] DOB: [birth year] DOA: 02/14/2018 PCP: System, Provider Not In  Brief summary   47 y.o. female with medical history significant of metastatic thyroid cancer (s/p thyroidectomy) extensive pulmonary mets, mets to bones (T spinal vertebral mets, pelvic mets), underwent chemotherapy, recent bone radiation treatment,  who presents with complaints of worsening shortness of breath, associated with productive cough, respiratory distress  ED Course: Pulse 97-117, respirations 19-43, and O2 saturations 94 to 100% on 2 L nasal cannula oxygen.  Patient was never noted to be hypoxic.  Labs revealed WBC 4.5, hemoglobin 9.8, CO2 20, anion gap 18, and troponin 0.  Chest x-ray sharp progression of pulmonary mets with new compression at T10.  Due to patient's contrast allergy a VQ scan was attempted, but inconclusive of the possibility of a pulmonary embolus.  Patient had been given 1 L of normal saline IV fluids, DuoNeb breathing treatments, 25 mcg of fentanyl for pain, and Tussionex without relief of symptoms.  [facility] called to admit due to difficulty breathing.  Assessment/Plan:  Community-acquired pneumonia: +worsening productive cough and shortness of breath.  + immunocompromised patient, susceptible for pneumonia. Unlikely PE. VQ was inconclusive due to extensive metastatic disease. Patient is not able to tolerate IV contrast even with steroid prep in the past.  Doppler leg: No DVT.  -persistent cough, productive, wheezing, but slowly improving, with empiric antibiotics of ceftriaxone and azithromycin (12/17<), bronchodilators, oxygen. Added steroids. Will taper to PO regimen in 24-48 hrs.  Monitor   Metastatic thyroid cancer (s/p thyroidectomy) extensive pulmonary mets, mets to bones (T spinal vertebral mets, pelvic mets), underwent chemotherapy, recent bone radiation treatment, chemo therapy, and palliative radiation.  cont pain control, f/u with  oncology as OP  Essential hypertension. Chronic tachycardia. Hold amlodipine due to soft BP. Monitor, Continue metoprolol   Hypothyroidism (s/p thyroidectomy). Resume home regimen. Goal tsh<0.1  Normocytic normochromic anemia:No reports of bleeding. Monitor   Prognosis is guarded with extensive metastatic cancer. Needs outpatient oncology,m palliative f/u   Code Status: full Family Communication: d/w patient, her family,  (indicate person spoken with, relationship, and if by phone, the number) Disposition Plan: remains inpatient. Home in 24-48 hrs    Consultants:  none  Procedures:  Ct chest   Antibiotics: Anti-infectives (From admission, onward)   Start     Dose/Rate Route Frequency Ordered Stop   02/15/18 0000  cefTRIAXone (ROCEPHIN) 1 g in sodium chloride 0.9 % 100 mL IVPB     1 g 200 mL/hr over 30 Minutes Intravenous Daily at bedtime 02/14/18 2350 02/21/18 2159   02/15/18 0000  azithromycin (ZITHROMAX) 500 mg in sodium chloride 0.9 % 250 mL IVPB     500 mg 250 mL/hr over 60 Minutes Intravenous Daily at bedtime 02/14/18 2350 02/21/18 2159       (indicate start date, and stop date if known)  HPI/Subjective: Improving  with bronchodilators, antibiotics, less cough and wheezing   Objective: Vitals:   02/17/18 0516 02/17/18 0813  BP: 105/67   Pulse: (!) 108   Resp: 16   Temp: 98.3 F (36.8 C)   SpO2: 99% 97%    Intake/Output Summary (Last 24 hours) at 02/17/2018 0932 Last data filed at 02/16/2018 1500 Gross per 24 hour  Intake 1031.25 ml  Output -  Net 1031.25 ml   Filed Weights   02/14/18 2341  Weight: 47.6 kg    Exam:   General:  Mild distress respiratory   Cardiovascular: s1,s2 tachycardia  Respiratory: wheezing, rales   Abdomen: soft, nt   Musculoskeletal: no pedal edema    Data Reviewed: Basic Metabolic Panel: Recent Labs  Lab 02/14/18 1557 02/15/18 0732  NA 140 138  K 3.9 4.2  CL 102 103  CO2 20* 19*  GLUCOSE 75 64*  BUN 13  10  CREATININE 0.40* 0.58  CALCIUM 9.8 9.0  MG  --  1.7   Liver Function Tests: Recent Labs  Lab 02/14/18 1557  AST 30  ALT 13  ALKPHOS 70  BILITOT 0.8  PROT 7.7  ALBUMIN 4.2   No results for input(s): LIPASE, AMYLASE in the last 168 hours. No results for input(s): AMMONIA in the last 168 hours. CBC: Recent Labs  Lab 02/14/18 1557 02/15/18 0732 02/17/18 0611  WBC 4.5 4.7 3.7*  NEUTROABS 3.0  --   --   HGB 9.8* 8.6* 8.9*  HCT 31.0* 28.1* 29.0*  MCV 86.1 86.5 87.9  PLT 210 202 192   Cardiac Enzymes: No results for input(s): CKTOTAL, CKMB, CKMBINDEX, TROPONINI in the last 168 hours. BNP (last 3 results) No results for input(s): BNP in the last 8760 hours.  ProBNP (last 3 results) No results for input(s): PROBNP in the last 8760 hours.  CBG: No results for input(s): GLUCAP in the last 168 hours.  No results found for this or any previous visit (from the past 240 hour(s)).   Studies: Ct Chest Wo Contrast  Result Date: 02/15/2018 CLINICAL DATA:  Metastatic thyroid cancer.  Shortness of breath. EXAM: CT CHEST WITHOUT CONTRAST TECHNIQUE: Multidetector CT imaging of the chest was performed following the standard protocol without IV contrast. COMPARISON:  Chest CT 04/24/2012 and chest x-ray 02/14/2018 FINDINGS: Cardiovascular: The heart is borderline enlarged. Small pericardial effusion. The aorta is normal in caliber. Calcifications noted at the aortic arch. Mediastinum/Nodes: Suspect mediastinal and hilar adenopathy. Lungs/Pleura: Diffuse progressive metastatic pulmonary disease with a large "cannon ball" metastasis. Patchy areas of atelectasis but no superimposed infiltrates. Upper Abdomen: No significant upper abdominal findings are identified. Musculoskeletal: Diffuse destructive bony metastatic disease progressive since prior study with extensive involvement of the spine, sternum and ribs. IMPRESSION: 1. Progressive diffuse pulmonary metastatic disease. Patchy areas of  atelectasis but no superimposed infiltrates or effusions. 2. Progressive diffuse destructive osseous metastatic disease. Aortic Atherosclerosis (ICD10-I70.0). Electronically Signed   By: Marijo Sanes M.D.   On: 02/15/2018 17:56   Vas [place] Lower Extremity Venous (dvt)  Result Date: 02/15/2018  Lower Venous Study Indications: Edema, and Swelling.  Performing Technologist: Abram Sander RVS  Examination Guidelines: A complete evaluation includes B-mode imaging, spectral Doppler, color Doppler, and power Doppler as needed of all accessible portions of each vessel. Bilateral testing is considered an integral part of a complete examination. Limited examinations for reoccurring indications may be performed as noted.  Right Venous Findings: +---------+---------------+---------+-----------+----------+-------+          CompressibilityPhasicitySpontaneityPropertiesSummary +---------+---------------+---------+-----------+----------+-------+ CFV      Full           Yes      Yes                          +---------+---------------+---------+-----------+----------+-------+ SFJ      Full                                                 +---------+---------------+---------+-----------+----------+-------+  FV Prox  Full                                                 +---------+---------------+---------+-----------+----------+-------+ FV Mid   Full                                                 +---------+---------------+---------+-----------+----------+-------+ FV DistalFull                                                 +---------+---------------+---------+-----------+----------+-------+ PFV      Full                                                 +---------+---------------+---------+-----------+----------+-------+ POP      Full           Yes      Yes                          +---------+---------------+---------+-----------+----------+-------+ PTV      Full                                                  +---------+---------------+---------+-----------+----------+-------+ PERO     Full                                                 +---------+---------------+---------+-----------+----------+-------+  Left Venous Findings: +---------+---------------+---------+-----------+----------+-------+          CompressibilityPhasicitySpontaneityPropertiesSummary +---------+---------------+---------+-----------+----------+-------+ CFV      Full           Yes      Yes                          +---------+---------------+---------+-----------+----------+-------+ SFJ      Full                                                 +---------+---------------+---------+-----------+----------+-------+ FV Prox  Full                                                 +---------+---------------+---------+-----------+----------+-------+ FV Mid   Full                                                 +---------+---------------+---------+-----------+----------+-------+  FV DistalFull                                                 +---------+---------------+---------+-----------+----------+-------+ PFV      Full                                                 +---------+---------------+---------+-----------+----------+-------+ POP      Full           Yes      Yes                          +---------+---------------+---------+-----------+----------+-------+ PTV      Full                                                 +---------+---------------+---------+-----------+----------+-------+ PERO     Full                                                 +---------+---------------+---------+-----------+----------+-------+    Summary: Right: There is no evidence of deep vein thrombosis in the lower extremity. No cystic structure found in the popliteal fossa. Left: There is no evidence of deep vein thrombosis in the lower extremity. No cystic structure  found in the popliteal fossa.  *See table(s) above for measurements and observations. Electronically signed by Deitra Mayo MD on 02/15/2018 at 4:31:47 PM.    Final     Scheduled Meds: . enoxaparin (LOVENOX) injection  40 mg Subcutaneous Q24H  . gabapentin  100 mg Oral TID  . ipratropium-albuterol  3 mL Nebulization QID  . levothyroxine  175 mcg Oral Q0600  . methylPREDNISolone (SOLU-MEDROL) injection  40 mg Intravenous Q12H  . metoprolol succinate  25 mg Oral Daily  . sodium chloride flush  3 mL Intravenous Q12H   Continuous Infusions: . azithromycin 500 mg (02/16/18 2242)  . 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 If 7PM-7AM, please contact night-coverage at [email or web address], password [facility] 02/17/2018, 9:32 AM  LOS: 2 days

## 2018-02-18 DIAGNOSIS — R0603 Acute respiratory distress: Secondary | ICD-10-CM

## 2018-02-18 MED ORDER — LACTULOSE 10 GM/15ML PO SOLN
20.0000 g | Freq: Once | ORAL | Status: AC
  Administered 2018-02-18: 20 g via ORAL
  Filled 2018-02-18: qty 30

## 2018-02-18 MED ORDER — IPRATROPIUM-ALBUTEROL 0.5-2.5 (3) MG/3ML IN SOLN: 3.0000 mL | Freq: Four times a day (QID) | RESPIRATORY_TRACT | 1 refills | Status: AC

## 2018-02-18 MED ORDER — IPRATROPIUM-ALBUTEROL 0.5-2.5 (3) MG/3ML IN SOLN: 3.0000 mL | Freq: Four times a day (QID) | RESPIRATORY_TRACT | Status: DC

## 2018-02-18 MED ORDER — AMOXICILLIN-POT CLAVULANATE 875-125 MG PO TABS: 1.0000 | ORAL_TABLET | Freq: Two times a day (BID) | ORAL | 0 refills | Status: AC

## 2018-02-18 MED ORDER — BISACODYL 5 MG PO TBEC
10.0000 mg | DELAYED_RELEASE_TABLET | Freq: Every day | ORAL | Status: DC
  Administered 2018-02-18: 10 mg via ORAL
  Filled 2018-02-18: qty 2

## 2018-02-18 MED ORDER — PREDNISONE 10 MG PO TABS: ORAL_TABLET | ORAL | 0 refills | Status: AC

## 2018-02-18 MED ORDER — BISACODYL 10 MG RE SUPP
10.0000 mg | Freq: Once | RECTAL | Status: AC
  Administered 2018-02-18: 10 mg via RECTAL
  Filled 2018-02-18: qty 1

## 2018-02-18 NOTE — Care Management Important Message (Signed)
Important Message  Patient Details  Name: Kendra Mendez MRN: [id number] Date of Birth: 1970/07/26   Medicare Important Message Given:  Yes    Kerin Salen 02/18/2018, 10:40 AMImportant Message  Patient Details  Name: Kendra Mendez MRN: [id number] Date of Birth: 05-24-1970   Medicare Important Message Given:  Yes    Kerin Salen 02/18/2018, 10:40 AM

## 2018-02-18 NOTE — Care Management Note (Signed)
Case Management Note  Patient Details  Name: Kendra Mendez MRN: [id number] Date of Birth: [birth year]  Subjective/Objective:                  Discharged  Action/Plan: Nebulizer and meds ordered through [facility] for home use.  Expected Discharge Date:  02/18/18               Expected Discharge Plan:  Home/Self Care  In-House Referral:     Discharge planning Services  CM Consult  Post Acute Care Choice:  Durable Medical Equipment Choice offered to:     DME Arranged:  Nebulizer/meds, Nebulizer machine DME Agency:  [place]:    [facility] Agency:     Status of Service:  Completed, signed off  If discussed at [place] of Stay Meetings, dates discussed:    Additional Comments:  Leeroy Cha, RN 02/18/2018, 2:18 PM

## 2018-02-18 NOTE — Progress Notes (Signed)
Patient given discharge instructions, and verbalized an understanding of all discharge instructions.  Patient agrees with discharge plan, and is being discharged in stable medical condition.  Patient given transportation via wheelchair. 

## 2018-02-18 NOTE — Discharge Summary (Addendum)
Physician Discharge Summary  KATALEIA QUARANTA ZOX:[id number] DOB: April 04, 1970 DOA: 02/14/2018  PCP: System, Provider Not In  Admit date: 02/14/2018 Discharge date: 02/18/2018  Admitted From: Home  Disposition: Home Recommendations for Outpatient Follow-up:  1. Follow up with PCP in 1-2 weeks 2. Please obtain BMP/CBC in one week   Home Health: None Equipment/Devices: None Discharge Condition stable CODE STATUS: Full code Diet recommendation: Cardiac Brief/Interim Summary:47 y.o.femalewith medical history significant ofmetastatic thyroid cancer (s/pthyroidectomy) extensive pulmonary mets, mets to bones (T spinal vertebral mets, pelvic mets), underwent chemotherapy, recent bone radiation treatment, who presents with complaints of worsening shortness of breath, associated with productive cough, respiratory distress  ED Course:Pulse 97-117, respirations 19-43, and O2 saturations 94 to 100% on 2 L nasal cannula oxygen. Patient was never noted to be hypoxic. Labs revealed WBC 4.5, hemoglobin 9.8, CO2 20, anion gap 18, andtroponin 0.Chest x-ray sharp progression of pulmonary mets with new compression at T10. Due to patient's contrast allergy a VQ scan was attempted, but inconclusive of the possibility of a pulmonary embolus. Patient had been given 1 L of normal saline IV fluids, DuoNeb breathing treatments, 25 mcg of fentanyl for pain, and Tussionex without relief of symptoms. [facility] called to admit due to difficulty breathing. 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  Her condition improved he will be discharged home today on Augmentin for 5 days along with the nebulizer machine and nebulizer  liquid. VQ was inconclusive due to extensive metastatic disease. Patient is not able to tolerate IV contrast even with steroid prep in the past.  Doppler leg: No DVT.   Metastatic thyroid cancer (s/pthyroidectomy) extensive pulmonary mets, mets to bones (T spinal vertebral mets, pelvic mets), underwent chemotherapy, recent bone radiation treatment, chemo therapy, and palliative radiation. cont pain control, f/u with oncology as OP  Essential hypertension. Chronic tachycardia. Hold amlodipine due to soft BP. Monitor, Continue metoprolol   Hypothyroidism (s/p thyroidectomy). Resume home regimen. Goal tsh<0.1  Normocytic normochromic anemia:No reports of bleeding. Monitor  Estimated body mass index is 21.21 kg/m as calculated from the following:   Height as of this encounter: 4\' 11"  (1.499 m).   Weight as of this encounter: 47.6 kg.  Discharge Instructions  Discharge Instructions    Call MD for:  difficulty breathing, headache or visual disturbances   Complete by:  As directed    Call MD for:  temperature >100.4   Complete by:  As directed    DME Nebulizer machine   Complete by:  As directed    Patient needs a nebulizer to treat with the following condition:  COPD (chronic obstructive pulmonary disease) ([place])   DME Nebulizer/meds   Complete by:  As directed    Patient needs a nebulizer to treat with the following condition:  COPD (chronic obstructive pulmonary disease) ([place])   Diet - low sodium heart healthy   Complete by:  As directed    Increase activity slowly   Complete by:  As directed      Allergies as of 02/18/2018      Reactions   Iohexol Hives, Itching, Swelling   Patient is allergic to IV dye and has pre-meds and still allergic to dye;so she was told not to ever have it again.  ak   Shellfish  Allergy Anaphylaxis   Ace Inhibitors Cough      Medication List    STOP taking these medications   amLODipine 10 MG tablet Commonly known as:  NORVASC     TAKE these  medications   gabapentin 100 MG capsule Commonly known as:  NEURONTIN Take 100 mg by mouth 3 (three) times daily.   levothyroxine 175 MCG tablet Commonly known as:  SYNTHROID, LEVOTHROID Take 175 mcg by mouth every morning.   metoprolol succinate 25 MG 24 hr tablet Commonly known as:  TOPROL-XL Take 25 mg by mouth daily.   multivitamin with minerals Tabs tablet Take 1 tablet by mouth every morning.   oxyCODONE 5 MG immediate release tablet Commonly known as:  Oxy IR/ROXICODONE Take 5 mg by mouth every 5 (five) hours as needed for severe pain.   polyethylene glycol packet Commonly known as:  MIRALAX / GLYCOLAX Take 17 g by mouth daily as needed for constipation.   predniSONE 10 MG tablet Commonly known as:  DELTASONE Take 3 tablets for the first 3 days then 2 tablets for the following 3 days and then 1 tablet daily till done   TYLENOL COLD MULTI-SYMPTOM 5-6.25-10-325 MG/15ML Liqd Generic drug:  Phenyleph-Doxylamine-DM-APAP Take 15 mLs by mouth daily as needed (cough).            Durable Medical Equipment  (From admission, onward)         Start     Ordered   02/18/18 0000  DME Nebulizer machine    Question:  Patient needs a nebulizer to treat with the following condition  Answer:  COPD (chronic obstructive pulmonary disease) ([place])   02/18/18 1331   02/18/18 0000  DME Nebulizer/meds    Question:  Patient needs a nebulizer to treat with the following condition  Answer:  COPD (chronic obstructive pulmonary disease) ([place])   02/18/18 1331          Allergies  Allergen Reactions  . Iohexol Hives, Itching and Swelling    Patient is allergic to IV dye and has pre-meds and still allergic to dye;so she was told not to ever have it again.  ak  . Shellfish Allergy Anaphylaxis  . Ace Inhibitors Cough    Consultations: None  Procedures/Studies: Dg Chest 2 View  Result Date: 02/14/2018 CLINICAL DATA:  Shortness of breath. Metastatic thyroid cancer. EXAM: CHEST - 2  VIEW COMPARISON:  Chest CT dated 04/24/2012 and chest x-ray dated 04/23/2012 FINDINGS: There has been a marked increase in the size and number of the metastases throughout both lungs. Heart size and pulmonary vascularity are normal. There are multiple progressive bone metastases. No effusions. New compression fracture of T10. Increased destruction of the distal right clavicle. New lytic lesions in the distal left clavicle and left scapula. Multiple rib metastases are new. IMPRESSION: 1. Marked progression of diffuse bilateral pulmonary metastases. 2. New compression fracture of T10 likely pathologic. 3. Progressive bone metastases. Electronically Signed   By: Lorriane Shire M.D.   On: 02/14/2018 17:22   Ct Chest Wo Contrast  Result Date: 02/15/2018 CLINICAL DATA:  Metastatic thyroid cancer.  Shortness of breath. EXAM: CT CHEST WITHOUT CONTRAST TECHNIQUE: Multidetector CT imaging of the chest was performed following the standard protocol without IV contrast. COMPARISON:  Chest CT 04/24/2012 and chest x-ray 02/14/2018 FINDINGS: Cardiovascular: The heart is borderline enlarged. Small pericardial effusion. The aorta is normal in caliber. Calcifications noted at the aortic arch. Mediastinum/Nodes: Suspect mediastinal and hilar adenopathy. Lungs/Pleura: Diffuse progressive metastatic pulmonary disease  with a large "cannon ball" metastasis. Patchy areas of atelectasis but no superimposed infiltrates. Upper Abdomen: No significant upper abdominal findings are identified. Musculoskeletal: Diffuse destructive bony metastatic disease progressive since prior study with extensive involvement of the spine, sternum and ribs. IMPRESSION: 1. Progressive diffuse pulmonary metastatic disease. Patchy areas of atelectasis but no superimposed infiltrates or effusions. 2. Progressive diffuse destructive osseous metastatic disease. Aortic Atherosclerosis (ICD10-I70.0). Electronically Signed   By: Marijo Sanes M.D.   On: 02/15/2018  17:56   Nm Pulmonary Perfusion  Result Date: 02/14/2018 CLINICAL DATA:  Shortness of breath and cough with known history of multiple lung metastatic lesions EXAM: NUCLEAR MEDICINE PERFUSION LUNG SCAN TECHNIQUE: Perfusion images were obtained in multiple projections after intravenous injection of radiopharmaceutical. RADIOPHARMACEUTICALS:  4.4 mCi Tc-83m MAA IV COMPARISON:  Chest x-ray from earlier in the same day. FINDINGS: Perfusion images demonstrate multiple rounded defects throughout both lungs which would correspond with the large metastatic lesion seen on recent chest x-ray. Suspicious for wedge-shaped defects are noted on the right laterally as well as on the left lung in the LPO image but given the significant metastatic disease they are not definitive for emboli without a ventilation series. IMPRESSION: Indeterminate scan for pulmonary embolism due to significant defects related to the metastatic disease. CT with premedication could possibly be performed depending on the severity of prior allergic reaction. Electronically Signed   By: Inez Catalina M.D.   On: 02/14/2018 21:29   Vas [place] Lower Extremity Venous (dvt)  Result Date: 02/15/2018  Lower Venous Study Indications: Edema, and Swelling.  Performing Technologist: Abram Sander RVS  Examination Guidelines: A complete evaluation includes B-mode imaging, spectral Doppler, color Doppler, and power Doppler as needed of all accessible portions of each vessel. Bilateral testing is considered an integral part of a complete examination. Limited examinations for reoccurring indications may be performed as noted.  Right Venous Findings: +---------+---------------+---------+-----------+----------+-------+          CompressibilityPhasicitySpontaneityPropertiesSummary +---------+---------------+---------+-----------+----------+-------+ CFV      Full           Yes      Yes                           +---------+---------------+---------+-----------+----------+-------+ SFJ      Full                                                 +---------+---------------+---------+-----------+----------+-------+ FV Prox  Full                                                 +---------+---------------+---------+-----------+----------+-------+ FV Mid   Full                                                 +---------+---------------+---------+-----------+----------+-------+ FV DistalFull                                                 +---------+---------------+---------+-----------+----------+-------+  PFV      Full                                                 +---------+---------------+---------+-----------+----------+-------+ POP      Full           Yes      Yes                          +---------+---------------+---------+-----------+----------+-------+ PTV      Full                                                 +---------+---------------+---------+-----------+----------+-------+ PERO     Full                                                 +---------+---------------+---------+-----------+----------+-------+  Left Venous Findings: +---------+---------------+---------+-----------+----------+-------+          CompressibilityPhasicitySpontaneityPropertiesSummary +---------+---------------+---------+-----------+----------+-------+ CFV      Full           Yes      Yes                          +---------+---------------+---------+-----------+----------+-------+ SFJ      Full                                                 +---------+---------------+---------+-----------+----------+-------+ FV Prox  Full                                                 +---------+---------------+---------+-----------+----------+-------+ FV Mid   Full                                                  +---------+---------------+---------+-----------+----------+-------+ FV DistalFull                                                 +---------+---------------+---------+-----------+----------+-------+ PFV      Full                                                 +---------+---------------+---------+-----------+----------+-------+ POP      Full           Yes      Yes                          +---------+---------------+---------+-----------+----------+-------+  PTV      Full                                                 +---------+---------------+---------+-----------+----------+-------+ PERO     Full                                                 +---------+---------------+---------+-----------+----------+-------+    Summary: Right: There is no evidence of deep vein thrombosis in the lower extremity. No cystic structure found in the popliteal fossa. Left: There is no evidence of deep vein thrombosis in the lower extremity. No cystic structure found in the popliteal fossa.  *See table(s) above for measurements and observations. Electronically signed by Deitra Mayo MD on 02/15/2018 at 4:31:47 PM.    Final     (Echo, Carotid, EGD, Colonoscopy, ERCP)    Subjective:   Discharge Exam: Vitals:   02/18/18 0408 02/18/18 0751  BP: 112/78   Pulse: (!) 117   Resp: 20   Temp: 97.6 F (36.4 C)   SpO2: 97% 98%   Vitals:   02/17/18 2002 02/17/18 2107 02/18/18 0408 02/18/18 0751  BP:  104/71 112/78   Pulse:  (!) 109 (!) 117   Resp:  20 20   Temp:  97.8 F (36.6 C) 97.6 F (36.4 C)   TempSrc:  Oral Oral   SpO2: 92% 100% 97% 98%  Weight:      Height:        General: Pt is alert, awake, not in acute distress Cardiovascular: RRR, S1/S2 +, no rubs, no gallops Respiratory: Scattered wheezing bilaterally, no wheezing, no rhonchi Abdominal: Soft, NT, ND, bowel sounds + Extremities: no edema, no cyanosis    The results of significant diagnostics from this  hospitalization (including imaging, microbiology, ancillary and laboratory) are listed below for reference.     Microbiology: No results found for this or any previous visit (from the past 240 hour(s)).   Labs: BNP (last 3 results) No results for input(s): BNP in the last 8760 hours. Basic Metabolic Panel: Recent Labs  Lab 02/14/18 1557 02/15/18 0732  NA 140 138  K 3.9 4.2  CL 102 103  CO2 20* 19*  GLUCOSE 75 64*  BUN 13 10  CREATININE 0.40* 0.58  CALCIUM 9.8 9.0  MG  --  1.7   Liver Function Tests: Recent Labs  Lab 02/14/18 1557  AST 30  ALT 13  ALKPHOS 70  BILITOT 0.8  PROT 7.7  ALBUMIN 4.2   No results for input(s): LIPASE, AMYLASE in the last 168 hours. No results for input(s): AMMONIA in the last 168 hours. CBC: Recent Labs  Lab 02/14/18 1557 02/15/18 0732 02/17/18 0611  WBC 4.5 4.7 3.7*  NEUTROABS 3.0  --   --   HGB 9.8* 8.6* 8.9*  HCT 31.0* 28.1* 29.0*  MCV 86.1 86.5 87.9  PLT 210 202 192   Cardiac Enzymes: No results for input(s): CKTOTAL, CKMB, CKMBINDEX, TROPONINI in the last 168 hours. BNP: Invalid input(s): POCBNP CBG: No results for input(s): GLUCAP in the last 168 hours. D-Dimer No results for input(s): DDIMER in the last 72 hours. Hgb A1c No results for input(s): HGBA1C in the last 72 hours. Lipid Profile No  results for input(s): CHOL, HDL, LDLCALC, TRIG, CHOLHDL, LDLDIRECT in the last 72 hours. Thyroid function studies No results for input(s): TSH, T4TOTAL, T3FREE, THYROIDAB in the last 72 hours.  Invalid input(s): FREET3 Anemia work up No results for input(s): VITAMINB12, FOLATE, FERRITIN, TIBC, IRON, RETICCTPCT in the last 72 hours. Urinalysis    Component Value Date/Time   COLORURINE YELLOW 02/11/2011 1051   APPEARANCEUR CLEAR 02/11/2011 1051   LABSPEC 1.012 02/11/2011 1051   PHURINE 6.0 02/11/2011 1051   GLUCOSEU NEGATIVE 02/11/2011 1051   HGBUR NEGATIVE 02/11/2011 1051   BILIRUBINUR NEGATIVE 02/11/2011 1051   KETONESUR  NEGATIVE 02/11/2011 1051   PROTEINUR NEGATIVE 02/11/2011 1051   UROBILINOGEN 1.0 02/11/2011 1051   NITRITE NEGATIVE 02/11/2011 1051   LEUKOCYTESUR SMALL (A) 02/11/2011 1051   Sepsis Labs Invalid input(s): PROCALCITONIN,  WBC,  LACTICIDVEN Microbiology No results found for this or any previous visit (from the past 240 hour(s)).   Time coordinating discharge: 34  minutes  SIGNED:   [name], MD  [facility] 02/18/2018, 2:08 PM Pager   If 7PM-7AM, please contact night-coverage [email or web address] Password [email or web address]

## 2018-03-24 ENCOUNTER — Telehealth: Payer: Self-pay

## 2018-03-24 ENCOUNTER — Other Ambulatory Visit: Payer: Medicare HMO | Admitting: Internal Medicine

## 2018-03-24 DIAGNOSIS — Z515 Encounter for palliative care: Secondary | ICD-10-CM

## 2018-03-24 NOTE — Telephone Encounter (Signed)
Phone call placed to patient to offer to schedule a visit with Palliative Care. Scheduled for 03/24/2018. Notified Clair Gulling, Clinical Navigator with [facility]

## 2018-03-24 NOTE — Progress Notes (Signed)
[facility] Telephone: [phone] Fax: [phone]  PATIENT NAME: Kendra Mendez DOB: [birth year] MRN: [id number]  NOTE:  Arrived at patient's home for pre scheduled Palliative visit.  Patient was not at home.  Daughter in law stated that pt went to [facility] for treatment and was supposed to have been on the way home.  She was unable to reach her by phone.  We will reach out to patient to attempt to reschedule.    Gonzella Lex, NP-C

## 2018-05-02 ENCOUNTER — Other Ambulatory Visit: Payer: Self-pay

## 2018-05-02 ENCOUNTER — Inpatient Hospital Stay (HOSPITAL_COMMUNITY)
Admission: EM | Admit: 2018-05-02 | Discharge: 2018-06-01 | DRG: 166 | Disposition: E | Payer: Medicare HMO | Attending: Internal Medicine | Admitting: Internal Medicine

## 2018-05-02 ENCOUNTER — Emergency Department (HOSPITAL_COMMUNITY): Payer: Medicare HMO

## 2018-05-02 ENCOUNTER — Encounter (HOSPITAL_COMMUNITY): Payer: Self-pay | Admitting: Emergency Medicine

## 2018-05-02 DIAGNOSIS — E890 Postprocedural hypothyroidism: Secondary | ICD-10-CM | POA: Diagnosis present

## 2018-05-02 DIAGNOSIS — C7802 Secondary malignant neoplasm of left lung: Secondary | ICD-10-CM | POA: Diagnosis present

## 2018-05-02 DIAGNOSIS — C7801 Secondary malignant neoplasm of right lung: Secondary | ICD-10-CM | POA: Diagnosis present

## 2018-05-02 DIAGNOSIS — M8448XA Pathological fracture, other site, initial encounter for fracture: Secondary | ICD-10-CM | POA: Diagnosis present

## 2018-05-02 DIAGNOSIS — Z8249 Family history of ischemic heart disease and other diseases of the circulatory system: Secondary | ICD-10-CM

## 2018-05-02 DIAGNOSIS — E872 Acidosis, unspecified: Secondary | ICD-10-CM | POA: Diagnosis present

## 2018-05-02 DIAGNOSIS — Z7952 Long term (current) use of systemic steroids: Secondary | ICD-10-CM

## 2018-05-02 DIAGNOSIS — I82409 Acute embolism and thrombosis of unspecified deep veins of unspecified lower extremity: Secondary | ICD-10-CM

## 2018-05-02 DIAGNOSIS — J9601 Acute respiratory failure with hypoxia: Secondary | ICD-10-CM | POA: Diagnosis not present

## 2018-05-02 DIAGNOSIS — I82441 Acute embolism and thrombosis of right tibial vein: Secondary | ICD-10-CM | POA: Diagnosis present

## 2018-05-02 DIAGNOSIS — Z515 Encounter for palliative care: Secondary | ICD-10-CM

## 2018-05-02 DIAGNOSIS — M940 Chondrocostal junction syndrome [Tietze]: Secondary | ICD-10-CM | POA: Diagnosis present

## 2018-05-02 DIAGNOSIS — Z833 Family history of diabetes mellitus: Secondary | ICD-10-CM

## 2018-05-02 DIAGNOSIS — Z66 Do not resuscitate: Secondary | ICD-10-CM | POA: Diagnosis present

## 2018-05-02 DIAGNOSIS — E874 Mixed disorder of acid-base balance: Secondary | ICD-10-CM | POA: Diagnosis present

## 2018-05-02 DIAGNOSIS — C73 Malignant neoplasm of thyroid gland: Secondary | ICD-10-CM | POA: Diagnosis present

## 2018-05-02 DIAGNOSIS — Z91041 Radiographic dye allergy status: Secondary | ICD-10-CM

## 2018-05-02 DIAGNOSIS — D630 Anemia in neoplastic disease: Secondary | ICD-10-CM | POA: Diagnosis present

## 2018-05-02 DIAGNOSIS — J189 Pneumonia, unspecified organism: Secondary | ICD-10-CM | POA: Diagnosis present

## 2018-05-02 DIAGNOSIS — Z85850 Personal history of malignant neoplasm of thyroid: Secondary | ICD-10-CM

## 2018-05-02 DIAGNOSIS — E039 Hypothyroidism, unspecified: Secondary | ICD-10-CM | POA: Diagnosis present

## 2018-05-02 DIAGNOSIS — C7951 Secondary malignant neoplasm of bone: Secondary | ICD-10-CM | POA: Diagnosis present

## 2018-05-02 DIAGNOSIS — Z79899 Other long term (current) drug therapy: Secondary | ICD-10-CM

## 2018-05-02 DIAGNOSIS — F419 Anxiety disorder, unspecified: Secondary | ICD-10-CM | POA: Diagnosis present

## 2018-05-02 DIAGNOSIS — Z923 Personal history of irradiation: Secondary | ICD-10-CM

## 2018-05-02 DIAGNOSIS — R079 Chest pain, unspecified: Secondary | ICD-10-CM | POA: Diagnosis present

## 2018-05-02 DIAGNOSIS — Y95 Nosocomial condition: Secondary | ICD-10-CM | POA: Diagnosis present

## 2018-05-02 DIAGNOSIS — Z79891 Long term (current) use of opiate analgesic: Secondary | ICD-10-CM

## 2018-05-02 DIAGNOSIS — Z7189 Other specified counseling: Secondary | ICD-10-CM

## 2018-05-02 DIAGNOSIS — D649 Anemia, unspecified: Secondary | ICD-10-CM | POA: Diagnosis present

## 2018-05-02 DIAGNOSIS — Z79890 Hormone replacement therapy (postmenopausal): Secondary | ICD-10-CM

## 2018-05-02 DIAGNOSIS — C7949 Secondary malignant neoplasm of other parts of nervous system: Secondary | ICD-10-CM | POA: Diagnosis present

## 2018-05-02 DIAGNOSIS — D638 Anemia in other chronic diseases classified elsewhere: Secondary | ICD-10-CM | POA: Diagnosis present

## 2018-05-02 LAB — COMPREHENSIVE METABOLIC PANEL
ALT: 16 U/L (ref 0–44)
AST: 82 U/L — ABNORMAL HIGH (ref 15–41)
Albumin: 3.5 g/dL (ref 3.5–5.0)
Alkaline Phosphatase: 86 U/L (ref 38–126)
Anion gap: 17 — ABNORMAL HIGH (ref 5–15)
BUN: 17 mg/dL (ref 6–20)
CHLORIDE: 103 mmol/L (ref 98–111)
CO2: 21 mmol/L — ABNORMAL LOW (ref 22–32)
Calcium: 9.8 mg/dL (ref 8.9–10.3)
Creatinine, Ser: 0.51 mg/dL (ref 0.44–1.00)
GFR calc Af Amer: >60 mL/min (ref >60)
GFR calc non Af Amer: >60 mL/min (ref >60)
Glucose, Bld: 108 mg/dL — ABNORMAL HIGH (ref 70–99)
Potassium: 4.4 mmol/L (ref 3.5–5.1)
Sodium: 141 mmol/L (ref 135–145)
Total Bilirubin: 0.4 mg/dL (ref 0.3–1.2)
Total Protein: 7.1 g/dL (ref 6.5–8.1)

## 2018-05-02 LAB — CBC WITH DIFFERENTIAL/PLATELET
Abs Immature Granulocytes: 0.03 10*3/uL (ref 0.00–0.07)
Basophils Absolute: 0 10*3/uL (ref 0.0–0.1)
Basophils Relative: 0 %
Eosinophils Absolute: 0.1 10*3/uL (ref 0.0–0.5)
Eosinophils Relative: 2 %
HCT: 33 % — ABNORMAL LOW (ref 36.0–46.0)
Hemoglobin: 9.7 g/dL — ABNORMAL LOW (ref 12.0–15.0)
Immature Granulocytes: 1 %
Lymphocytes Relative: 18 %
Lymphs Abs: 0.9 10*3/uL (ref 0.7–4.0)
MCH: 27.2 pg (ref 26.0–34.0)
MCHC: 29.4 g/dL — ABNORMAL LOW (ref 30.0–36.0)
MCV: 92.7 fL (ref 80.0–100.0)
Monocytes Absolute: 0.5 10*3/uL (ref 0.1–1.0)
Monocytes Relative: 11 %
Neutro Abs: 3.3 10*3/uL (ref 1.7–7.7)
Neutrophils Relative %: 68 %
Platelets: 254 10*3/uL (ref 150–400)
RBC: 3.56 MIL/uL — AB (ref 3.87–5.11)
RDW: 18.6 % — ABNORMAL HIGH (ref 11.5–15.5)
WBC: 4.9 10*3/uL (ref 4.0–10.5)
nRBC: 0 % (ref 0.0–0.2)

## 2018-05-02 LAB — PROTIME-INR
INR: 1.2 (ref 0.8–1.2)
Prothrombin Time: 15.1 seconds (ref 11.4–15.2)

## 2018-05-02 LAB — LACTIC ACID, PLASMA: Lactic Acid, Venous: 8.7 mmol/L (ref 0.5–1.9)

## 2018-05-02 LAB — I-STAT TROPONIN, ED: TROPONIN I, POC: 0 ng/mL (ref 0.00–0.08)

## 2018-05-02 MED ORDER — FENTANYL CITRATE (PF) 100 MCG/2ML IJ SOLN: 50.0000 ug | Freq: Once | INTRAMUSCULAR | Status: DC

## 2018-05-02 MED ORDER — SODIUM CHLORIDE 0.9 % IV BOLUS
1000.0000 mL | Freq: Once | INTRAVENOUS | Status: AC
  Administered 2018-05-02: 1000 mL via INTRAVENOUS

## 2018-05-02 MED ORDER — VANCOMYCIN HCL IN DEXTROSE 1-5 GM/200ML-% IV SOLN
1000.0000 mg | Freq: Once | INTRAVENOUS | Status: AC
  Administered 2018-05-02: 1000 mg via INTRAVENOUS
  Filled 2018-05-02: qty 200

## 2018-05-02 MED ORDER — LORAZEPAM 2 MG/ML IJ SOLN
1.0000 mg | Freq: Once | INTRAMUSCULAR | Status: AC
  Administered 2018-05-02: 1 mg via INTRAVENOUS
  Filled 2018-05-02: qty 1

## 2018-05-02 MED ORDER — FENTANYL CITRATE (PF) 100 MCG/2ML IJ SOLN
25.0000 ug | Freq: Once | INTRAMUSCULAR | Status: AC
  Administered 2018-05-02: 25 ug via INTRAVENOUS
  Filled 2018-05-02: qty 2

## 2018-05-02 MED ORDER — SODIUM CHLORIDE 0.9 % IV SOLN
2.0000 g | Freq: Once | INTRAVENOUS | Status: AC
  Administered 2018-05-02: 2 g via INTRAVENOUS
  Filled 2018-05-02: qty 2

## 2018-05-02 NOTE — ED Notes (Signed)
Patient transported to XR. 

## 2018-05-02 NOTE — ED Provider Notes (Signed)
[facility] DEPT Provider Note   CSN: [id number] Arrival date & time: 05/18/2018  2119    History   Chief Complaint Chief Complaint  Patient presents with  . Cough  . Suspected Pneumonia    HPI Kendra Mendez is a 48 y.o. 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  Patient states that she was seen and evaluated by her PCP on 04/29/2018.  EMS states that she was diagnosed with pneumonia but she states that she was not diagnosed with pneumonia.  She was given an antibiotic which she will complete today, a cough suppressant and Tylenol.  She continues to have cough and chest pain that is worse with coughing.  Cough is intermittently productive with mucus.  She denies history of similar chest pain in the past.  Denies any fevers but does have sick contacts with similar symptoms at home including her grandson.  Denies any recent travel, leg swelling, history of DVT or PE, abdominal pain.     HPI  Past Medical History:  Diagnosis Date  . Thyroid cancer (Kendra Mendez)    Metastatic to lungs and bone  . Thyroid disease     Patient Active Problem List   Diagnosis Date Noted  . Palliative care encounter 03/24/2018  . Pathologic thoracic fracture 02/15/2018  . 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 Normocytic normochromic anemia 02/15/2018  . Hypothyroidism 02/15/2018  . Respiratory distress 02/14/2018  . Cough with hemoptysis 04/23/2012  . Thyroid cancer ([place]) 04/23/2012  . Lung metastases ([name]) 04/23/2012    Past Surgical History:  Procedure Laterality Date  . CESAREAN SECTION    . THYROIDECTOMY       OB History   No obstetric history on file.      Home Medications    Prior to Admission medications   Medication Sig Start Date End Date Taking? Authorizing  Provider  benzonatate (TESSALON) 100 MG capsule Take 100 mg by mouth 3 (three) times daily as needed for cough.  04/29/18  Yes [provider]  dexamethasone (DECADRON) 4 MG tablet Take 4 mg by mouth 4 (four) times daily. 03/19/18  Yes [provider]  gabapentin (NEURONTIN) 100 MG capsule Take 100 mg by mouth 3 (three) times daily. 11/12/17 11/12/18 Yes [provider]  ipratropium-albuterol (DUONEB) 0.5-2.5 (3) MG/3ML SOLN Take 3 mLs by nebulization every 6 (six) hours. 02/18/18  Yes [name], MD  levothyroxine (SYNTHROID, LEVOTHROID) 175 MCG tablet Take 175 mcg by mouth every morning.    Yes [provider]  moxifloxacin (AVELOX) 400 MG tablet Take 400 mg by mouth daily. 04/29/18  Yes [provider]  oxyCODONE (OXY IR/ROXICODONE) 5 MG immediate release tablet Take 5 mg by mouth every 5 (five) hours as needed for severe pain.    Yes [provider]  Phenyleph-Doxylamine-DM-APAP (TYLENOL COLD MULTI-SYMPTOM) 5-6.25-10-325 MG/15ML LIQD Take 15 mLs by mouth daily as needed (cough).   Yes [provider]  polyethylene glycol (MIRALAX / GLYCOLAX) packet Take 17 g by mouth daily as needed for constipation. 05/11/17  Yes [provider]  predniSONE (DELTASONE) 10 MG tablet Take 3 tablets for the first 3 days then 2 tablets for the following 3 days and then 1 tablet daily till done Patient not taking: Reported on 05/18/2018 02/18/18  [name], MD    Family History History reviewed. No pertinent family history.  Social History Social History   Tobacco Use  . Smoking status: Never Smoker  . Smokeless tobacco: Never Used  Substance Use Topics  . Alcohol use: No  . Drug use: No     Allergies   Iohexol; Shellfish allergy; and Ace inhibitors   Review of Systems Review of Systems  Constitutional: Negative for appetite change, chills and fever.  HENT: Negative for ear pain, rhinorrhea, sneezing and sore  throat.   Eyes: Negative for photophobia and visual disturbance.  Respiratory: Positive for cough and shortness of breath. Negative for chest tightness and wheezing.   Cardiovascular: Positive for chest pain. Negative for palpitations.  Gastrointestinal: Negative for abdominal pain, blood in stool, constipation, diarrhea, nausea and vomiting.  Genitourinary: Negative for dysuria, hematuria and urgency.  Musculoskeletal: Negative for myalgias.  Skin: Negative for rash.  Neurological: Negative for dizziness, weakness and light-headedness.     Physical Exam Updated Vital Signs BP (!) 143/89 (BP Location: Right Arm)   Pulse (!) 111   Temp 98.6 F (37 C) (Oral)   Resp (!) 25   Ht 5' (1.524 m)   Wt 48.5 kg   SpO2 92%   BMI 20.90 kg/m   Physical Exam Vitals signs and nursing note reviewed.  Constitutional:      General: She is not in acute distress.    Appearance: She is well-developed.  HENT:     Head: Normocephalic and atraumatic.     Nose: Nose normal.  Eyes:     General: No scleral icterus.       Left eye: No discharge.     Conjunctiva/sclera: Conjunctivae normal.  Neck:     Musculoskeletal: Normal range of motion and neck supple.  Cardiovascular:     Rate and Rhythm: Regular rhythm. Tachycardia present.     Heart sounds: Normal heart sounds. No murmur. No friction rub. No gallop.   Pulmonary:     Effort: Pulmonary effort is normal. Tachypnea present. No respiratory distress.     Breath sounds: Normal breath sounds.  Abdominal:     General: Bowel sounds are normal. There is no distension.     Palpations: Abdomen is soft.     Tenderness: There is no abdominal tenderness. There is no guarding.  Musculoskeletal: Normal range of motion.  Skin:    General: Skin is warm and dry.     Findings: No rash.  Neurological:     Mental Status: She is alert.     Motor: No abnormal muscle tone.     Coordination: Coordination normal.  Psychiatric:        Mood and Affect: Mood is  anxious.      ED Treatments / Results  Labs (all labs ordered are listed, but only abnormal results are displayed) Labs Reviewed  COMPREHENSIVE METABOLIC PANEL - Abnormal; Notable for the following components:      Result Value   CO2 21 (*)    Glucose, Bld 108 (*)    AST 82 (*)    Anion gap 17 (*)    All other components within normal limits  LACTIC ACID, PLASMA - Abnormal; Notable for the following components:   Lactic Acid, Venous 8.7 (*)    All other components within normal limits  CBC WITH DIFFERENTIAL/PLATELET - Abnormal; Notable for the following components:   RBC 3.56 (*)    Hemoglobin 9.7 (*)    HCT 33.0 (*)  MCHC 29.4 (*)    RDW 18.6 (*)    All other components within normal limits  CULTURE, BLOOD (ROUTINE X 2)  CULTURE, BLOOD (ROUTINE X 2)  URINE CULTURE  PROTIME-INR  LACTIC ACID, PLASMA  URINALYSIS, ROUTINE W REFLEX MICROSCOPIC  I-STAT TROPONIN, ED    EKG EKG Interpretation  Date/Time:  Monday May 02 2018 21:47:36 EST Ventricular Rate:  106 PR Interval:    QRS Duration: 76 QT Interval:  327 QTC Calculation: 435 R Axis:   37 Text Interpretation:  Sinus tachycardia Probable LVH with secondary repol abnrm No significant change since last tracing Confirmed by Dorie Rank [phone]) on 05/13/2018 10:25:42 PM   Radiology Dg Chest 2 View  Result Date: 05/25/2018 CLINICAL DATA:  Metastatic thyroid cancer. Pneumonia. EXAM: CHEST - 2 VIEW COMPARISON:  Chest CT 02/15/2018 FINDINGS: Numerous large masses throughout both lungs. Cardiomediastinal size is normal. No discrete airspace consolidation identified. Large callus of the right clavicle is unchanged. Osseous lesions are better characterized on prior CT. There is extensive erosion of the left scapula. IMPRESSION: Extensive metastatic disease to the lungs and left scapula. No superimposed acute airspace disease. Electronically Signed   By: Ulyses Jarred M.D.   On: 05/01/2018 22:22    Procedures Procedures  (including critical care time)  CRITICAL CARE Performed by: Delia Heady   Total critical care time: 35 minutes  Critical care time was exclusive of separately billable procedures and treating other patients.  Critical care was necessary to treat or prevent imminent or life-threatening deterioration.  Critical care was time spent personally by me on the following activities: development of treatment plan with patient and/or surrogate as well as nursing, discussions with consultants, evaluation of patient's response to treatment, examination of patient, obtaining history from patient or surrogate, ordering and performing treatments and interventions, ordering and review of laboratory studies, ordering and review of radiographic studies, pulse oximetry and re-evaluation of patient's condition.  Medications Ordered in ED Medications  vancomycin (VANCOCIN) IVPB 1000 mg/200 mL premix (1,000 mg Intravenous New Bag/Given 05/13/2018 2329)  ceFEPIme (MAXIPIME) 2 g in sodium chloride 0.9 % 100 mL IVPB (2 g Intravenous New Bag/Given 05/09/2018 2327)  sodium chloride 0.9 % bolus 1,000 mL (1,000 mLs Intravenous New Bag/Given 05/21/2018 2239)  LORazepam (ATIVAN) injection 1 mg (1 mg Intravenous Given 05/29/2018 2242)  fentaNYL (SUBLIMAZE) injection 25 mcg (25 mcg Intravenous Given 05/28/2018 2241)  sodium chloride 0.9 % bolus 1,000 mL (1,000 mLs Intravenous New Bag/Given 05/15/2018 2327)     Initial Impression / Assessment and Plan / ED Course  I have reviewed the triage vital signs and the nursing notes.  Pertinent labs & imaging results that were available during my care of the patient were reviewed by me and considered in my medical decision making (see chart for details).        48 year old female with a past medical history of thyroid cancer with mets to the lung and bone currently on oral chemotherapy presents to ED for persistent cough for the past 4 days and chest pain that began today.  She has been treated for  URI with what sounds like a Z-Pak, cough suppressant and Tylenol.  She continues to have symptoms and developed chest pain that has been constant for the past 10 hours.  Sick contacts at home with similar symptoms.  On my exam patient is tachycardic and tachypneic. Lungs CTAB.  She is afebrile with no recent use of antipyretics.  Lab work significant for lactic acid of  8.7, hemoglobin of 9.7 which is similar to baseline.  She has an anion gap of 17.  Blood cultures obtained.  Chest x-ray shows extensive metastatic disease with no superimposed infection.  EKG shows sinus tachycardia with no changes from prior tracings.  Due to patient's chronic medical issues, symptoms, lab work, she will need to be treated for pneumonia that has failed outpatient treatment. Of note, patient was admitted to hospital at the end of December 2019.  She had similar symptoms then, VQ scan was done which was inconclusive.  However she was negative for DVT and was treated for pneumonia with IV antibiotics and nebulizer treatments.    Portions of this note were generated with [employer]. Dictation errors may occur despite best attempts at proofreading.   Final Clinical Impressions(s) / ED Diagnoses   Final diagnoses:  HCAP (healthcare-associated pneumonia)    ED Discharge Orders    None       Delia Heady, PA-C 05/21/2018 2348    Drenda Freeze, MD 05/03/18 1130

## 2018-05-02 NOTE — Progress Notes (Signed)
A consult was received from an ED physician for cefepime and vancomycin per pharmacy dosing.  The patient's profile has been reviewed for ht/wt/allergies/indication/available labs.   A one time order has been placed for Cefepime 2 gm and Vancomycin 1 Gm.  Further antibiotics/pharmacy consults should be ordered by admitting physician if indicated.                       Thank you, Dorrene German 05/01/2018  10:55 PM

## 2018-05-02 NOTE — ED Notes (Signed)
Bed: WA21 Expected date:  Expected time:  Means of arrival:  Comments: 48 yo F/ CA pt pneumonia

## 2018-05-02 NOTE — ED Triage Notes (Signed)
Patient arrived by EMS from home. Pt was diagnosed w/ pneumonia Friday. Pt was given antibiotic, cough suppressant, and tylenol. Pt c/o productive cough and chest pain from cough.   Pt has thyroid cancer that has metastasized to the bone. Pt is on oral chemotherapy drugs.   SpO2 96% on RA, pain 08/10 per EMS, BP 134/64, HR 104.   EMS 150 mcg of Fentanyl.

## 2018-05-02 NOTE — ED Notes (Signed)
Date and time results received: 05/12/2018 2248 (use smartphrase ".now" to insert current time)  Test: lactic acid Critical Value: 8.7  Name of Provider Notified: America Brown  Orders Received? Or Actions Taken?: Actions Taken: reported to [employer] lactic acid 8.7.

## 2018-05-02 NOTE — ED Notes (Signed)
EKG given to Dr. Knapp. 

## 2018-05-03 ENCOUNTER — Inpatient Hospital Stay (HOSPITAL_COMMUNITY): Payer: Medicare HMO

## 2018-05-03 ENCOUNTER — Encounter (HOSPITAL_COMMUNITY): Payer: Self-pay | Admitting: Internal Medicine

## 2018-05-03 DIAGNOSIS — E039 Hypothyroidism, unspecified: Secondary | ICD-10-CM | POA: Diagnosis not present

## 2018-05-03 DIAGNOSIS — E872 Acidosis, unspecified: Secondary | ICD-10-CM | POA: Diagnosis present

## 2018-05-03 DIAGNOSIS — E874 Mixed disorder of acid-base balance: Secondary | ICD-10-CM | POA: Diagnosis present

## 2018-05-03 DIAGNOSIS — R012 Other cardiac sounds: Secondary | ICD-10-CM | POA: Diagnosis not present

## 2018-05-03 DIAGNOSIS — C7949 Secondary malignant neoplasm of other parts of nervous system: Secondary | ICD-10-CM | POA: Diagnosis present

## 2018-05-03 DIAGNOSIS — Z79890 Hormone replacement therapy (postmenopausal): Secondary | ICD-10-CM | POA: Diagnosis not present

## 2018-05-03 DIAGNOSIS — Z833 Family history of diabetes mellitus: Secondary | ICD-10-CM | POA: Diagnosis not present

## 2018-05-03 DIAGNOSIS — Z66 Do not resuscitate: Secondary | ICD-10-CM | POA: Diagnosis present

## 2018-05-03 DIAGNOSIS — E890 Postprocedural hypothyroidism: Secondary | ICD-10-CM | POA: Diagnosis present

## 2018-05-03 DIAGNOSIS — D638 Anemia in other chronic diseases classified elsewhere: Secondary | ICD-10-CM | POA: Diagnosis present

## 2018-05-03 DIAGNOSIS — I82441 Acute embolism and thrombosis of right tibial vein: Secondary | ICD-10-CM | POA: Diagnosis present

## 2018-05-03 DIAGNOSIS — J9601 Acute respiratory failure with hypoxia: Secondary | ICD-10-CM | POA: Diagnosis present

## 2018-05-03 DIAGNOSIS — C73 Malignant neoplasm of thyroid gland: Secondary | ICD-10-CM

## 2018-05-03 DIAGNOSIS — C7802 Secondary malignant neoplasm of left lung: Secondary | ICD-10-CM | POA: Diagnosis present

## 2018-05-03 DIAGNOSIS — C7951 Secondary malignant neoplasm of bone: Secondary | ICD-10-CM | POA: Diagnosis present

## 2018-05-03 DIAGNOSIS — D630 Anemia in neoplastic disease: Secondary | ICD-10-CM | POA: Diagnosis present

## 2018-05-03 DIAGNOSIS — Z91041 Radiographic dye allergy status: Secondary | ICD-10-CM | POA: Diagnosis not present

## 2018-05-03 DIAGNOSIS — Z7952 Long term (current) use of systemic steroids: Secondary | ICD-10-CM | POA: Diagnosis not present

## 2018-05-03 DIAGNOSIS — R7989 Other specified abnormal findings of blood chemistry: Secondary | ICD-10-CM

## 2018-05-03 DIAGNOSIS — Z79891 Long term (current) use of opiate analgesic: Secondary | ICD-10-CM | POA: Diagnosis not present

## 2018-05-03 DIAGNOSIS — Z79899 Other long term (current) drug therapy: Secondary | ICD-10-CM | POA: Diagnosis not present

## 2018-05-03 DIAGNOSIS — F419 Anxiety disorder, unspecified: Secondary | ICD-10-CM | POA: Diagnosis present

## 2018-05-03 DIAGNOSIS — M940 Chondrocostal junction syndrome [Tietze]: Secondary | ICD-10-CM | POA: Diagnosis present

## 2018-05-03 DIAGNOSIS — Z515 Encounter for palliative care: Secondary | ICD-10-CM | POA: Diagnosis not present

## 2018-05-03 DIAGNOSIS — Z923 Personal history of irradiation: Secondary | ICD-10-CM | POA: Diagnosis not present

## 2018-05-03 DIAGNOSIS — M8448XA Pathological fracture, other site, initial encounter for fracture: Secondary | ICD-10-CM | POA: Diagnosis present

## 2018-05-03 DIAGNOSIS — Z7189 Other specified counseling: Secondary | ICD-10-CM | POA: Diagnosis not present

## 2018-05-03 DIAGNOSIS — D649 Anemia, unspecified: Secondary | ICD-10-CM | POA: Diagnosis not present

## 2018-05-03 DIAGNOSIS — R079 Chest pain, unspecified: Secondary | ICD-10-CM | POA: Diagnosis not present

## 2018-05-03 DIAGNOSIS — Y95 Nosocomial condition: Secondary | ICD-10-CM | POA: Diagnosis present

## 2018-05-03 DIAGNOSIS — J189 Pneumonia, unspecified organism: Secondary | ICD-10-CM | POA: Diagnosis present

## 2018-05-03 DIAGNOSIS — C7801 Secondary malignant neoplasm of right lung: Secondary | ICD-10-CM | POA: Diagnosis present

## 2018-05-03 LAB — URINALYSIS, ROUTINE W REFLEX MICROSCOPIC
Bilirubin Urine: NEGATIVE
Glucose, UA: NEGATIVE mg/dL
Hgb urine dipstick: NEGATIVE
Ketones, ur: 5 mg/dL — AB
Leukocytes,Ua: NEGATIVE
NITRITE: NEGATIVE
PROTEIN: NEGATIVE mg/dL
Specific Gravity, Urine: 1.018 (ref 1.005–1.030)
pH: 5 (ref 5.0–8.0)

## 2018-05-03 LAB — CBC
HCT: 30.7 % — ABNORMAL LOW (ref 36.0–46.0)
HEMOGLOBIN: 9 g/dL — AB (ref 12.0–15.0)
MCH: 27.3 pg (ref 26.0–34.0)
MCHC: 29.3 g/dL — AB (ref 30.0–36.0)
MCV: 93 fL (ref 80.0–100.0)
NRBC: 0 % (ref 0.0–0.2)
Platelets: 218 10*3/uL (ref 150–400)
RBC: 3.3 MIL/uL — ABNORMAL LOW (ref 3.87–5.11)
RDW: 18.5 % — ABNORMAL HIGH (ref 11.5–15.5)
WBC: 3.8 10*3/uL — ABNORMAL LOW (ref 4.0–10.5)

## 2018-05-03 LAB — BASIC METABOLIC PANEL
Anion gap: 14 (ref 5–15)
BUN: 12 mg/dL (ref 6–20)
CHLORIDE: 105 mmol/L (ref 98–111)
CO2: 18 mmol/L — AB (ref 22–32)
Calcium: 8.9 mg/dL (ref 8.9–10.3)
Creatinine, Ser: 0.4 mg/dL — ABNORMAL LOW (ref 0.44–1.00)
GFR calc Af Amer: >60 mL/min (ref >60)
GFR calc non Af Amer: >60 mL/min (ref >60)
Glucose, Bld: 94 mg/dL (ref 70–99)
Potassium: 4.2 mmol/L (ref 3.5–5.1)
Sodium: 137 mmol/L (ref 135–145)

## 2018-05-03 LAB — BLOOD GAS, ARTERIAL
Acid-base deficit: 4.5 mmol/L — ABNORMAL HIGH (ref 0.0–2.0)
BICARBONATE: 19.4 mmol/L — AB (ref 20.0–28.0)
Drawn by: 11249
FIO2: 21
O2 Saturation: 95.3 %
Patient temperature: 98.6
pCO2 arterial: 32.8 mmHg (ref 32.0–48.0)
pH, Arterial: 7.39 (ref 7.350–7.450)
pO2, Arterial: 85.6 mmHg (ref 83.0–108.0)

## 2018-05-03 LAB — MRSA PCR SCREENING: MRSA by PCR: NEGATIVE

## 2018-05-03 LAB — LACTIC ACID, PLASMA
Lactic Acid, Venous: 6.7 mmol/L (ref 0.5–1.9)
Lactic Acid, Venous: 7.2 mmol/L (ref 0.5–1.9)

## 2018-05-03 LAB — INFLUENZA PANEL BY PCR (TYPE A & B)
Influenza A By PCR: NEGATIVE
Influenza B By PCR: NEGATIVE

## 2018-05-03 LAB — D-DIMER, QUANTITATIVE: D-Dimer, Quant: 1.94 ug/mL-FEU — ABNORMAL HIGH (ref 0.00–0.50)

## 2018-05-03 LAB — PROCALCITONIN: Procalcitonin: 1.36 ng/mL

## 2018-05-03 LAB — HEMOGLOBIN AND HEMATOCRIT, BLOOD
HCT: 29.6 % — ABNORMAL LOW (ref 36.0–46.0)
Hemoglobin: 8.9 g/dL — ABNORMAL LOW (ref 12.0–15.0)

## 2018-05-03 LAB — HIV ANTIBODY (ROUTINE TESTING W REFLEX): HIV Screen 4th Generation wRfx: NONREACTIVE

## 2018-05-03 MED ORDER — BENZONATATE 100 MG PO CAPS
100.0000 mg | ORAL_CAPSULE | Freq: Three times a day (TID) | ORAL | Status: DC | PRN
  Administered 2018-05-03 – 2018-05-04 (×3): 100 mg via ORAL
  Filled 2018-05-03 (×3): qty 1

## 2018-05-03 MED ORDER — HEPARIN SODIUM (PORCINE) 5000 UNIT/ML IJ SOLN
5000.0000 [IU] | Freq: Three times a day (TID) | INTRAMUSCULAR | Status: DC
  Administered 2018-05-03 – 2018-05-06 (×9): 5000 [IU] via SUBCUTANEOUS
  Filled 2018-05-03 (×10): qty 1

## 2018-05-03 MED ORDER — IPRATROPIUM BROMIDE 0.02 % IN SOLN
0.5000 mg | Freq: Three times a day (TID) | RESPIRATORY_TRACT | Status: DC
  Administered 2018-05-03 – 2018-05-06 (×10): 0.5 mg via RESPIRATORY_TRACT
  Filled 2018-05-03 (×10): qty 2.5

## 2018-05-03 MED ORDER — LEVALBUTEROL HCL 1.25 MG/0.5ML IN NEBU
INHALATION_SOLUTION | RESPIRATORY_TRACT | Status: AC
  Filled 2018-05-03: qty 0.5

## 2018-05-03 MED ORDER — CHLORHEXIDINE GLUCONATE CLOTH 2 % EX PADS: 6.0000 | MEDICATED_PAD | Freq: Every day | CUTANEOUS | Status: DC

## 2018-05-03 MED ORDER — METHYLPREDNISOLONE SODIUM SUCC 125 MG IJ SOLR
60.0000 mg | Freq: Two times a day (BID) | INTRAMUSCULAR | Status: DC
  Administered 2018-05-03 – 2018-05-08 (×11): 60 mg via INTRAVENOUS
  Filled 2018-05-03 (×11): qty 2

## 2018-05-03 MED ORDER — SODIUM CHLORIDE 0.9 % IV BOLUS
500.0000 mL | Freq: Once | INTRAVENOUS | Status: AC
  Administered 2018-05-03: 500 mL via INTRAVENOUS

## 2018-05-03 MED ORDER — ONDANSETRON HCL 4 MG PO TABS: 4.0000 mg | ORAL_TABLET | Freq: Four times a day (QID) | ORAL | Status: DC | PRN

## 2018-05-03 MED ORDER — HEPARIN SODIUM (PORCINE) 5000 UNIT/ML IJ SOLN: 5000.0000 [IU] | Freq: Three times a day (TID) | INTRAMUSCULAR | Status: DC

## 2018-05-03 MED ORDER — SODIUM CHLORIDE 0.9 % IV SOLN
INTRAVENOUS | Status: DC
  Administered 2018-05-03 – 2018-05-06 (×7): via INTRAVENOUS

## 2018-05-03 MED ORDER — ONDANSETRON HCL 4 MG/2ML IJ SOLN
4.0000 mg | Freq: Four times a day (QID) | INTRAMUSCULAR | Status: DC | PRN
  Administered 2018-05-04: 4 mg via INTRAVENOUS
  Filled 2018-05-03: qty 2

## 2018-05-03 MED ORDER — TECHNETIUM TC 99M DIETHYLENETRIAME-PENTAACETIC ACID
29.7000 | Freq: Once | INTRAVENOUS | Status: AC
  Administered 2018-05-03: 29.7 via RESPIRATORY_TRACT

## 2018-05-03 MED ORDER — ORAL CARE MOUTH RINSE
15.0000 mL | Freq: Two times a day (BID) | OROMUCOSAL | Status: DC
  Administered 2018-05-03 – 2018-05-10 (×10): 15 mL via OROMUCOSAL

## 2018-05-03 MED ORDER — IPRATROPIUM BROMIDE 0.02 % IN SOLN
0.5000 mg | RESPIRATORY_TRACT | Status: DC
  Administered 2018-05-03: 0.5 mg via RESPIRATORY_TRACT
  Filled 2018-05-03: qty 2.5

## 2018-05-03 MED ORDER — GABAPENTIN 100 MG PO CAPS
100.0000 mg | ORAL_CAPSULE | Freq: Three times a day (TID) | ORAL | Status: DC
  Administered 2018-05-03: 100 mg via ORAL
  Filled 2018-05-03: qty 1

## 2018-05-03 MED ORDER — ACETAMINOPHEN 650 MG RE SUPP: 650.0000 mg | Freq: Four times a day (QID) | RECTAL | Status: DC | PRN

## 2018-05-03 MED ORDER — LEVALBUTEROL HCL 1.25 MG/0.5ML IN NEBU
1.2500 mg | INHALATION_SOLUTION | Freq: Four times a day (QID) | RESPIRATORY_TRACT | Status: DC
  Administered 2018-05-03: 1.25 mg via RESPIRATORY_TRACT
  Filled 2018-05-03: qty 0.5

## 2018-05-03 MED ORDER — OXYCODONE HCL 5 MG PO TABS
5.0000 mg | ORAL_TABLET | ORAL | Status: DC | PRN
  Administered 2018-05-03 – 2018-05-04 (×4): 5 mg via ORAL
  Filled 2018-05-03 (×5): qty 1

## 2018-05-03 MED ORDER — POLYETHYLENE GLYCOL 3350 17 G PO PACK
17.0000 g | PACK | Freq: Every day | ORAL | Status: DC | PRN
  Administered 2018-05-04: 17 g via ORAL
  Filled 2018-05-03: qty 1

## 2018-05-03 MED ORDER — CHLORHEXIDINE GLUCONATE CLOTH 2 % EX PADS
6.0000 | MEDICATED_PAD | Freq: Every day | CUTANEOUS | Status: DC
  Administered 2018-05-04: 6 via TOPICAL

## 2018-05-03 MED ORDER — LEVALBUTEROL HCL 1.25 MG/0.5ML IN NEBU
1.2500 mg | INHALATION_SOLUTION | Freq: Three times a day (TID) | RESPIRATORY_TRACT | Status: DC
  Administered 2018-05-03 – 2018-05-06 (×10): 1.25 mg via RESPIRATORY_TRACT
  Filled 2018-05-03 (×10): qty 0.5

## 2018-05-03 MED ORDER — LEVOTHYROXINE SODIUM 25 MCG PO TABS
175.0000 ug | ORAL_TABLET | Freq: Every day | ORAL | Status: DC
  Administered 2018-05-03 – 2018-05-05 (×3): 175 ug via ORAL
  Filled 2018-05-03 (×5): qty 1

## 2018-05-03 MED ORDER — TECHNETIUM TO 99M ALBUMIN AGGREGATED
4.1000 | Freq: Once | INTRAVENOUS | Status: AC
  Administered 2018-05-03: 4.1 via INTRAVENOUS

## 2018-05-03 MED ORDER — ACETAMINOPHEN 325 MG PO TABS
650.0000 mg | ORAL_TABLET | Freq: Four times a day (QID) | ORAL | Status: DC | PRN
  Administered 2018-05-04 – 2018-05-05 (×2): 650 mg via ORAL
  Filled 2018-05-03 (×2): qty 2

## 2018-05-03 MED ORDER — GABAPENTIN 100 MG PO CAPS
200.0000 mg | ORAL_CAPSULE | Freq: Three times a day (TID) | ORAL | Status: DC
  Administered 2018-05-03 – 2018-05-05 (×7): 200 mg via ORAL
  Filled 2018-05-03 (×7): qty 2

## 2018-05-03 MED ORDER — IPRATROPIUM BROMIDE 0.02 % IN SOLN
RESPIRATORY_TRACT | Status: AC
  Filled 2018-05-03: qty 2.5

## 2018-05-03 MED ORDER — MORPHINE SULFATE (PF) 4 MG/ML IV SOLN
4.0000 mg | Freq: Once | INTRAVENOUS | Status: AC
  Administered 2018-05-03: 4 mg via INTRAVENOUS
  Filled 2018-05-03: qty 1

## 2018-05-03 MED ORDER — MORPHINE SULFATE (PF) 2 MG/ML IV SOLN
2.0000 mg | INTRAVENOUS | Status: DC | PRN
  Administered 2018-05-04 – 2018-05-05 (×4): 2 mg via INTRAVENOUS
  Filled 2018-05-03 (×4): qty 1

## 2018-05-03 NOTE — ED Notes (Signed)
ED TO INPATIENT HANDOFF REPORT  ED Nurse Name and Phone #:  Juventino Slovak, RN [phone]  S Name/Age/Gender Kendra Mendez 48 y.o. female Room/Bed: WA21/WA21  Code Status   Code Status: Full Code  Home/SNF/Other Home Patient oriented to: self, place, time and situation Is this baseline? Yes   Triage Complete: Triage complete  Chief Complaint Pnuemonia   Triage Note Patient arrived by EMS from home. Pt was diagnosed w/ pneumonia Friday. Pt was given antibiotic, cough suppressant, and tylenol. Pt c/o productive cough and chest pain from cough.   Pt has thyroid cancer that has metastasized to the bone. Pt is on oral chemotherapy drugs.   SpO2 96% on RA, pain 08/10 per EMS, BP 134/64, HR 104.   EMS 150 mcg of Fentanyl.    Allergies Allergies  Allergen Reactions  . Iohexol Hives, Itching and Swelling    Patient is allergic to IV dye and has pre-meds and still allergic to dye;so she was told not to ever have it again.  ak  . Shellfish Allergy Anaphylaxis  . Ace Inhibitors Cough    Level of Care/Admitting Diagnosis ED Disposition    ED Disposition Condition [facility]: [facility] [[id number]]  Level of Care: Stepdown [14]  Admit to SDU based on following criteria: Hemodynamic compromise or significant risk of instability:  Patient requiring short term acute titration and management of vasoactive drips, and invasive monitoring (i.e., CVP and Arterial line).  Diagnosis: Acute respiratory failure with hypoxia [facility]) [[id number]]  Admitting Physician: Ivor Costa [[id number]]  Attending Physician: Ivor Costa [phone]  Estimated length of stay: past midnight tomorrow  Certification:: I certify this patient will need inpatient services for at least 2 midnights  PT Class (Do Not Modify): Inpatient [101]  PT Acc Code (Do Not Modify): Private [1]       B Medical/Surgery History Past Medical History:  Diagnosis Date  . Thyroid cancer ([place])     Metastatic to lungs and bone  . Thyroid disease    Past Surgical History:  Procedure Laterality Date  . CESAREAN SECTION    . THYROIDECTOMY       A IV Location/Drains/Wounds Patient Lines/Drains/Airways Status   Active Line/Drains/Airways    Name:   Placement date:   Placement time:   Site:   Days:   Peripheral IV 05/31/2018 Left Antecubital   05/07/2018    2239    Antecubital   1   Peripheral IV 05/18/2018 Right Antecubital   05/13/2018    2325    Antecubital   1          Intake/Output Last 24 hours No intake or output data in the 24 hours ending 05/03/18 0401  Labs/Imaging Results for orders placed or performed during the hospital encounter of 05/19/2018 (from the past 48 hour(s))  Lactic acid, plasma     Status: Abnormal   Collection Time: 05/01/2018  9:54 PM  Result Value Ref Range   Lactic Acid, Venous 8.7 (HH) 0.5 - 1.9 mmol/L    Comment: CRITICAL RESULT CALLED TO, READ BACK BY AND VERIFIED WITH: RN BRIAN JESSE AT 2243 05/01/2018 CRUICKSHANK A Performed at [facility], [place] [address], [place], [place] [address]   Comprehensive metabolic panel     Status: Abnormal   Collection Time: 05/05/2018 10:12 PM  Result Value Ref Range   Sodium 141 135 - 145 mmol/L   Potassium 4.4 3.5 - 5.1 mmol/L   Chloride 103  98 - 111 mmol/L   CO2 21 (L) 22 - 32 mmol/L   Glucose, Bld 108 (H) 70 - 99 mg/dL   BUN 17 6 - 20 mg/dL   Creatinine, Ser 0.51 0.44 - 1.00 mg/dL   Calcium 9.8 8.9 - 10.3 mg/dL   Total Protein 7.1 6.5 - 8.1 g/dL   Albumin 3.5 3.5 - 5.0 g/dL   AST 82 (H) 15 - 41 U/L   ALT 16 0 - 44 U/L   Alkaline Phosphatase 86 38 - 126 U/L   Total Bilirubin 0.4 0.3 - 1.2 mg/dL   GFR calc non Af Amer >60 >60 mL/min   GFR calc Af Amer >60 >60 mL/min   Anion gap 17 (H) 5 - 15    Comment: Performed at [facility], [place] [address] [place], [place] [address]  CBC with Differential     Status: Abnormal   Collection Time: 05/14/2018 10:12 PM  Result Value Ref  Range   WBC 4.9 4.0 - 10.5 K/uL   RBC 3.56 (L) 3.87 - 5.11 MIL/uL   Hemoglobin 9.7 (L) 12.0 - 15.0 g/dL   HCT 33.0 (L) 36.0 - 46.0 %   MCV 92.7 80.0 - 100.0 fL   MCH 27.2 26.0 - 34.0 pg   MCHC 29.4 (L) 30.0 - 36.0 g/dL   RDW 18.6 (H) 11.5 - 15.5 %   Platelets 254 150 - 400 K/uL   nRBC 0.0 0.0 - 0.2 %   Neutrophils Relative % 68 %   Neutro Abs 3.3 1.7 - 7.7 K/uL   Lymphocytes Relative 18 %   Lymphs Abs 0.9 0.7 - 4.0 K/uL   Monocytes Relative 11 %   Monocytes Absolute 0.5 0.1 - 1.0 K/uL   Eosinophils Relative 2 %   Eosinophils Absolute 0.1 0.0 - 0.5 K/uL   Basophils Relative 0 %   Basophils Absolute 0.0 0.0 - 0.1 K/uL   Immature Granulocytes 1 %   Abs Immature Granulocytes 0.03 0.00 - 0.07 K/uL    Comment: Performed at [facility], [place] [address], [place], [place] [address]  Protime-INR     Status: None   Collection Time: 05/21/2018 10:12 PM  Result Value Ref Range   Prothrombin Time 15.1 11.4 - 15.2 seconds   INR 1.2 0.8 - 1.2    Comment: (NOTE) INR goal varies based on device and disease states. Performed at [facility], [place] [address] [place], [place] [address]   D-dimer, quantitative (not at [facility])     Status: Abnormal   Collection Time: 05/26/2018 10:12 PM  Result Value Ref Range   D-Dimer, Quant 1.94 (H) 0.00 - 0.50 ug/mL-FEU    Comment: (NOTE) At the manufacturer cut-off of 0.50 ug/mL FEU, this assay has been documented to exclude PE with a sensitivity and negative predictive value of 97 to 99%.  At this time, this assay has not been approved by the FDA to exclude DVT/VTE. Results should be correlated with clinical presentation. Performed at [facility], [place] [address], [place], [place] [address]   I-Stat Troponin, ED (not at [facility])     Status: None   Collection Time: 05/28/2018 10:25 PM  Result Value Ref Range   Troponin i, poc 0.00 0.00 - 0.08 ng/mL   Comment 3            Comment: Due to the release  kinetics of cTnI, a negative result within the first hours of the onset of symptoms  does not rule out myocardial infarction with certainty. If myocardial infarction is still suspected, repeat the test at appropriate intervals.   Influenza panel by PCR (type A & B)     Status: None   Collection Time: 05/08/2018 11:49 PM  Result Value Ref Range   Influenza A By PCR NEGATIVE NEGATIVE   Influenza B By PCR NEGATIVE NEGATIVE    Comment: (NOTE) The Xpert Xpress Flu assay is intended as an aid in the diagnosis of  influenza and should not be used as a sole basis for treatment.  This  assay is FDA approved for nasopharyngeal swab specimens only. Nasal  washings and aspirates are unacceptable for Xpert Xpress Flu testing. Performed at [facility], [place] [address] [place], [place] [address]   Blood gas, arterial     Status: Abnormal   Collection Time: 05/03/18  2:24 AM  Result Value Ref Range   FIO2 21.00    pH, Arterial 7.390 7.350 - 7.450   pCO2 arterial 32.8 32.0 - 48.0 mmHg   pO2, Arterial 85.6 83.0 - 108.0 mmHg   Bicarbonate 19.4 (L) 20.0 - 28.0 mmol/L   Acid-base deficit 4.5 (H) 0.0 - 2.0 mmol/L   O2 Saturation 95.3 %   Patient temperature 98.6    Collection site RIGHT RADIAL    Drawn by [phone]    Sample type ARTERIAL DRAW    Allens test (pass/fail) PASS PASS    Comment: Performed at [facility], [place] [address] [place], [place] [address]   Dg Chest 2 View  Result Date: 05/20/2018 CLINICAL DATA:  Metastatic thyroid cancer. Pneumonia. EXAM: CHEST - 2 VIEW COMPARISON:  Chest CT 02/15/2018 FINDINGS: Numerous large masses throughout both lungs. Cardiomediastinal size is normal. No discrete airspace consolidation identified. Large callus of the right clavicle is unchanged. Osseous lesions are better characterized on prior CT. There is extensive erosion of the left scapula. IMPRESSION: Extensive metastatic disease to the lungs and left scapula. No  superimposed acute airspace disease. Electronically Signed   By: Ulyses Jarred M.D.   On: 05/16/2018 22:22    Pending Labs Unresulted Labs (From admission, onward)    Start     Ordered   05/03/18 4174  Basic metabolic panel  Tomorrow morning,   R     05/03/18 0245   05/03/18 0500  CBC  Tomorrow morning,   R     05/03/18 0245   05/03/18 0248  Hemoglobin and hematocrit, blood  ONCE - STAT,   R     05/03/18 0247   05/03/18 0245  HIV antibody (Routine Testing)  Once,   R     05/03/18 0245   05/03/18 0244  Lactic acid, plasma  STAT Now then every 3 hours,   STAT     05/03/18 0243   05/03/18 0244  Procalcitonin  ONCE - STAT,   R     05/03/18 0243   05/15/2018 2254  Urine culture  ONCE - STAT,   STAT     05/27/2018 2253   05/10/2018 2250  Urinalysis, Routine w reflex microscopic  ONCE - STAT,   STAT     05/12/2018 2249   05/27/2018 2155  Culture, blood (Routine x 2)  BLOOD CULTURE X 2,   STAT     05/21/2018 2154          Vitals/Pain Today's Vitals   05/03/18 0200 05/03/18 0218 05/03/18 0316 05/03/18 0330  BP: (!) 151/94   140/84  Pulse: Marland Kitchen)  124   (!) 122  Resp: (!) 35   (!) 30  Temp:      TempSrc:      SpO2: 94%  92% 93%  Weight:      Height:      PainSc:  6       Isolation Precautions No active isolations  Medications Medications  morphine 2 MG/ML injection 2 mg (has no administration in time range)  oxyCODONE (Oxy IR/ROXICODONE) immediate release tablet 5 mg (has no administration in time range)  levothyroxine (SYNTHROID, LEVOTHROID) tablet 175 mcg (has no administration in time range)  polyethylene glycol (MIRALAX / GLYCOLAX) packet 17 g (has no administration in time range)  gabapentin (NEURONTIN) capsule 100 mg (has no administration in time range)  benzonatate (TESSALON) capsule 100 mg (has no administration in time range)  methylPREDNISolone sodium succinate (SOLU-MEDROL) 125 mg/2 mL injection 60 mg (60 mg Intravenous Given 05/03/18 0344)  ipratropium (ATROVENT) nebulizer  solution 0.5 mg (0.5 mg Nebulization Given 05/03/18 0315)  levalbuterol (XOPENEX) nebulizer solution 1.25 mg (1.25 mg Nebulization Given 05/03/18 0316)  0.9 %  sodium chloride infusion ( Intravenous New Bag/Given 05/03/18 0343)  acetaminophen (TYLENOL) tablet 650 mg (has no administration in time range)    Or  acetaminophen (TYLENOL) suppository 650 mg (has no administration in time range)  ondansetron (ZOFRAN) tablet 4 mg (has no administration in time range)    Or  ondansetron (ZOFRAN) injection 4 mg (has no administration in time range)  ipratropium (ATROVENT) 0.02 % nebulizer solution (  Not Given 05/03/18 0325)  levalbuterol (XOPENEX) 1.25 MG/0.5ML nebulizer solution (  Not Given 05/03/18 0325)  sodium chloride 0.9 % bolus 1,000 mL (0 mLs Intravenous Stopped 05/03/18 0358)  LORazepam (ATIVAN) injection 1 mg (1 mg Intravenous Given 05/04/2018 2242)  fentaNYL (SUBLIMAZE) injection 25 mcg (25 mcg Intravenous Given 05/04/2018 2241)  sodium chloride 0.9 % bolus 1,000 mL (0 mLs Intravenous Stopped 05/03/18 0357)  vancomycin (VANCOCIN) IVPB 1000 mg/200 mL premix (0 mg Intravenous Stopped 05/03/18 0357)  ceFEPIme (MAXIPIME) 2 g in sodium chloride 0.9 % 100 mL IVPB (0 g Intravenous Stopped 05/03/18 0012)  morphine 4 MG/ML injection 4 mg (4 mg Intravenous Given 05/03/18 0143)    Mobility walks with device Low fall risk   Focused Assessments Pulmonary Assessment Handoff:  Lung sounds: Bilateral Breath Sounds: Diminished, Clear L Breath Sounds: Diminished, Clear R Breath Sounds: Inspiratory wheezes, Diminished O2 Device: Room [employer]

## 2018-05-03 NOTE — Progress Notes (Signed)
CRITICAL VALUE ALERT  Critical Value: lactic 7.2  Date & Time Notied:  7:40 AM 05/03/2018  Provider Notified: Cyndia Skeeters  Orders Received/Actions taken: Continue to monitor.

## 2018-05-03 NOTE — Consult Note (Signed)
..   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 Known metastatic disease to spine, bone, brain & lungs with prior hemoptysis s/p bronchial artery embolization.  Hemodynamically stable admitted by Hospitalist. PCCM consulted for guidance in mgmt given her complex history.  Past Medical History  Thyroid cancer with lung metastases  Pathologic thoracic fractures Normocytic normochromic anemia  Bilateral pulmonary nodules S/p bronchial artery embolization in 2005 Lytic lesion of left humerus 2012 Pathological fracture of R hip s/p repair in 2014-2015 Denosumab stopped by pt due to hemoptysis- restarted in 2018 11/2017 Lenvatinib restarted but held 12/31/17- last dose was on 05/26/2018 12/31/17 Initiation of palliative XRT to T10/L3   Subjective:  Pt reports she is afraid of not knowing when she is going to die.  Tearful.   Consults:  PCCM 05/03/2018  Procedures:  3/02 Bipap  Significant Diagnostic Tests:  12/20/13 PET/CT interval progression in diffuse osseous mets with increase in size of soft tissue component of multiple lesions and development of new lesions involving the L skull base and T10 vertebral body. Redemonstration of increased radiotracer uptake involving the posterior parietal skull/parietal lobe. Diffuse bilateral pulm mets which are stable to slightly increased in size. 05/09/17 MRI pelvis no disease in A/P. Similar osseous mets. T10 and L3. Extensive bilateral pulmonary mets. 12/03/17 CT C/A/P slightly increased widespread pulmonary mets. Similar severe widespread osseous mets with epidural extension at T10.  2V CXR 05/16/2018 >> Numerous large masses throughout both lungs. No discrete airspace consolidation identified. Large callus of the  right clavicle is unchanged. Osseous lesions are better characterized on prior CT. There is extensive erosion of the left scapula.  Micro Data:  BCx2 3/2 >>  UC 3/3 >>  Antimicrobials:  Vancomycin 3/2 x1    Cefepime- 3/2 x1 dose   Objective   Blood pressure 136/81, pulse 100, temperature 97.9 F (36.6 C), temperature source Axillary, resp. rate (!) 27, height 5' (1.524 m), weight 51.5 kg, SpO2 98 %.        Intake/Output Summary (Last 24 hours) at 05/03/2018 0916 Last data filed at 05/03/2018 0600 Gross per 24 hour  Intake 483.48 ml  Output -  Net 483.48 ml   Filed Weights   05/10/2018 2151 05/03/18 0449  Weight: 48.5 kg 51.5 kg    EXAM General: small adult female sitting in bed in NAD  HEENT: MM pink/moist, no jvd Neuro: Awake, alert, oriented, speech clear PSY: tearful, appropriate CV: s1s2 rrr, no m/r/g PULM: even/non-labored, lungs bilaterally clear anterior  NG:EXBM, non-tender, bsx4 active  Extremities: warm/dry, trace to 1+ pitting BLE edema  Skin: no rashes or lesions  Assessment & Plan:   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  -first diagnosed in 1997, followed at [facility] -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 s/p palliative XRT continues on palliative chemo -h/o hemoptysis requiring bronchoscopy and Bronchial artery embolization P: Agree with palliative care discussion and palliative measures for symptom control  DNI but pt continues to want CPR, she wants to speak with her family (per last discussion) Wean O2 for sats >90%   Pain control with oxycodone, may need to escalate dosing & add anxiolytic   Low index of suspicion for PE -D  dimer elevation not reliable in the setting of metastatic CA  -pleuritic chest pain very tender around sternum -no contrast due to allergy noted when she had her restaging scans P: Hold anticoagulation given hx of hemoptysis  Await previously ordered LE doppler, VQ scan   R/O  Pneumonia -documentation from [place] she had a recent admission for penumonia -CXR appears to be metastatic spread rather than multifocal penumonia -Influenza panel negtive P: Follow cultures  Hold further abx at this time  Follow fever curve / WBC trend   Best practice:  Diet: regular diet  Pain/Anxiety/Delirium protocol (if indicated): n/a VAP protocol (if indicated): n/a DVT prophylaxis: sq heparin, SCD's  GI prophylaxis: per primary Glucose control: per primary  Mobility: As tolerated Code Status: DNI but ok with CPR and ACLS.  Needs palliative care consult.  Family Communication: No family at bedside.  Patient updated on plan of care Disposition: Per [facility].   PCCM will sign off. Please call back if new needs arise.   Labs   CBC: Recent Labs  Lab 05/22/2018 2212 05/03/18 0407 05/03/18 0644  WBC 4.9  --  3.8*  NEUTROABS 3.3  --   --   HGB 9.7* 8.9* 9.0*  HCT 33.0* 29.6* 30.7*  MCV 92.7  --  93.0  PLT 254  --  150    Basic Metabolic Panel: Recent Labs  Lab 05/08/2018 2212 05/03/18 0644  NA 141 137  K 4.4 4.2  CL 103 105  CO2 21* 18*  GLUCOSE 108* 94  BUN 17 12  CREATININE 0.51 0.40*  CALCIUM 9.8 8.9   GFR: Estimated Creatinine Clearance: 61.1 mL/min (A) (by C-G formula based on SCr of 0.4 mg/dL (L)). Recent Labs  Lab 05/26/2018 2154 05/15/2018 2212 05/03/18 0406 05/03/18 0407 05/03/18 0644  PROCALCITON  --   --   --  1.36  --   WBC  --  4.9  --   --  3.8*  LATICACIDVEN 8.7*  --  6.7*  --  7.2*    Liver Function Tests: Recent Labs  Lab 05/21/2018 2212  AST 82*  ALT 16  ALKPHOS 86  BILITOT 0.4  PROT 7.1  ALBUMIN 3.5   No results for input(s): LIPASE, AMYLASE in the last 168 hours. No results for input(s): AMMONIA in the last 168 hours.  ABG    Component Value Date/Time   PHART 7.390 05/03/2018 0224   PCO2ART 32.8 05/03/2018 0224   PO2ART 85.6 05/03/2018 0224   HCO3 19.4 (L) 05/03/2018 0224   ACIDBASEDEF 4.5 (H) 05/03/2018 0224   O2SAT 95.3  05/03/2018 0224     Coagulation Profile: Recent Labs  Lab 05/09/2018 2212  INR 1.2    Cardiac Enzymes: No results for input(s): CKTOTAL, CKMB, CKMBINDEX, TROPONINI in the last 168 hours.  HbA1C: No results found for: HGBA1C  CBG: No results for input(s): GLUCAP in the last 168 hours.   Critical care time: n/a    Noe Gens, NP-C  Pulmonary & Critical Care Pgr: [phone] or if no answer [phone] 05/03/2018, 9:17 AM

## 2018-05-03 NOTE — Progress Notes (Signed)
Bilateral lower extremities venous duplex exam completed. Please see preliminary notes on CV PROC under chart review. Ketrick Matney H Karina Nofsinger(RDMS RVT) 05/03/18 9:42 AM

## 2018-05-03 NOTE — ED Notes (Signed)
Respiratory has been contacted for collection of arterial blood gas.

## 2018-05-03 NOTE — Progress Notes (Signed)
MD said hold off on BIPAP due to ABG results. RT will continue to monitor

## 2018-05-03 NOTE — Progress Notes (Signed)
PROGRESS NOTE  Kendra Mendez CWU:[id number] DOB: April 01, 1970 DOA: 05/20/2018 PCP: System, Provider Not In   LOS: 0 days   Brief Narrative / Interim history: 48-year-old female with history of stage IV follicular thyroid carcinoma with metastasis to spine, bone, brain and larynx, history of hemoptysis status post bronchial artery embolization, pathological fracture of right hip, status post XRT on T10/L3, anemia, hypothyroidism who presented with progressive dyspnea and chest pain for over 2 weeks. She was tachycardic and tachypneic on arrival.  ED work-up significant for positive d-dimer to 1.94 and lactic acidosis to 8.7.  WBC, ABG, troponin, urinalysis and electrolytes not impressive. Chest x-ray revealed metastatic disease to the lungs and left scapula.  Received vancomycin and cefepime for possible pneumonia in ED.  Started on IV heparin empirically and admitted to SDU.  Patient could not get CTA due to contrast allergy.  PCCM consulted.  Subjective: Still with some shortness of breath.  Chest pain about 4 out of 10.  No abdominal pain.  Seems to have some labored breathing.  Anticipating a meeting with the palliative care.   Assessment & Plan: Principal Problem:   Acute respiratory failure with hypoxia (HCC) Active Problems:   Thyroid cancer (HCC)   Normocytic normochromic anemia   Hypothyroidism   Chest pain   Lactic acidosis  Acute respiratory distress: likely due to extensive metastatic thyroid cancer to the lung.  Work-up so far does not suggest infectious process.  PE and DVT need to be excluded.  -Follow-up VQ scan and lower extremity venous Doppler. -Continue breathing treatments and systemic steroid. -Continue cough suppressants. -PRN oxygen. -Follow cultures. -Appreciate PCCM input -Palliative care consulted.  Musculoskeletal chest pain: Reproducible with palpation.  EKG and troponin not impressive. -Exclude PE as above -PRN oxycodone and morphine for pain  Stage IV  follicular thyroid cancer: Status post thyroidectomy.  Extensive pulmonary, osseous, and pelvic metastasis.  Status post radiation, chemotherapy and palliative radiation.  Currently on clinical chemotherapy at [facility]. -Palliative care as above  Normocytic normochromic anemia of chronic disease: Stable -Continue monitoring  Lactic acidosis: Remains elevated at 7.2.  Due to respiratory distress. -We will give intermittent gentle boluses and continue trending. -Continue maintenance IV fluid.  Scheduled Meds: . [START ON 05/04/2018] Chlorhexidine Gluconate Cloth  6 each Topical Daily  . gabapentin  100 mg Oral TID  . heparin injection (subcutaneous)  5,000 Units Subcutaneous Q8H  . ipratropium      . ipratropium  0.5 mg Nebulization TID  . levalbuterol      . levalbuterol  1.25 mg Nebulization TID  . levothyroxine  175 mcg Oral Q0600  . mouth rinse  15 mL Mouth Rinse BID  . methylPREDNISolone (SOLU-MEDROL) injection  60 mg Intravenous Q12H   Continuous Infusions: . sodium chloride 125 mL/hr at 05/03/18 0348   PRN Meds:.acetaminophen **OR** acetaminophen, benzonatate, morphine injection, ondansetron **OR** ondansetron (ZOFRAN) IV, oxyCODONE, polyethylene glycol  DVT prophylaxis: On heparin drip. Code Status: DNI. Family Communication: None at bedside. Disposition Plan: Remains inpatient.  Critically sick.  Consultants:   PCCM  Palliative care  Procedures:   None  Antimicrobials:  Vancomycin and cefepime in ED on 3/2  Objective: Vitals:   05/03/18 0600 05/03/18 0700 05/03/18 0800 05/03/18 0808  BP: 127/81 136/81    Pulse: (!) 107 100    Resp: (!) 25 (!) 27    Temp:   97.9 F (36.6 C)   TempSrc:   Axillary   SpO2: 98% 98%  98%  Weight:  Height:        Intake/Output Summary (Last 24 hours) at 05/03/2018 0903 Last data filed at 05/03/2018 0600 Gross per 24 hour  Intake 483.48 ml  Output -  Net 483.48 ml   Filed Weights   05/31/2018 2151 05/03/18 0449  Weight:  48.5 kg 51.5 kg    Examination:  GENERAL: Labored breathing. EYES - vision grossly intact. Sclera anicteric.  NOSE- no gross deformity or drainage MOUTH - no oral lesions noted THROAT- no swelling or erythema LUNGS: labored breathing.  Fair air movement.  Rhonchi bilaterally. HEART: Tachycardic.  Regular rhythm.  Heart sounds normal. ABD: Bowel sounds present. Soft. Non tender.  MSK/EXT: Moves extremities. No obvious deformity.  Tenderness to palpation across her chest. SKIN: no apparent skin lesion.  NEURO: Awake, alert and oriented appropriately.  No gross deficit.  PSYCH: Flat affect.  Data Reviewed: I have independently reviewed following labs and imaging studies   CBC: Recent Labs  Lab 05/30/2018 2212 05/03/18 0407 05/03/18 0644  WBC 4.9  --  3.8*  NEUTROABS 3.3  --   --   HGB 9.7* 8.9* 9.0*  HCT 33.0* 29.6* 30.7*  MCV 92.7  --  93.0  PLT 254  --  063   Basic Metabolic Panel: Recent Labs  Lab 05/08/2018 2212 05/03/18 0644  NA 141 137  K 4.4 4.2  CL 103 105  CO2 21* 18*  GLUCOSE 108* 94  BUN 17 12  CREATININE 0.51 0.40*  CALCIUM 9.8 8.9   GFR: Estimated Creatinine Clearance: 61.1 mL/min (A) (by C-G formula based on SCr of 0.4 mg/dL (L)). Liver Function Tests: Recent Labs  Lab 05/16/2018 2212  AST 82*  ALT 16  ALKPHOS 86  BILITOT 0.4  PROT 7.1  ALBUMIN 3.5   No results for input(s): LIPASE, AMYLASE in the last 168 hours. No results for input(s): AMMONIA in the last 168 hours. Coagulation Profile: Recent Labs  Lab 05/27/2018 2212  INR 1.2   Cardiac Enzymes: No results for input(s): CKTOTAL, CKMB, CKMBINDEX, TROPONINI in the last 168 hours. BNP (last 3 results) No results for input(s): PROBNP in the last 8760 hours. HbA1C: No results for input(s): HGBA1C in the last 72 hours. CBG: No results for input(s): GLUCAP in the last 168 hours. Lipid Profile: No results for input(s): CHOL, HDL, LDLCALC, TRIG, CHOLHDL, LDLDIRECT in the last 72  hours. Thyroid Function Tests: No results for input(s): TSH, T4TOTAL, FREET4, T3FREE, THYROIDAB in the last 72 hours. Anemia Panel: No results for input(s): VITAMINB12, FOLATE, FERRITIN, TIBC, IRON, RETICCTPCT in the last 72 hours. Urine analysis:    Component Value Date/Time   COLORURINE STRAW (A) 05/03/2018 0411   APPEARANCEUR CLEAR 05/03/2018 0411   LABSPEC 1.018 05/03/2018 0411   PHURINE 5.0 05/03/2018 0411   GLUCOSEU NEGATIVE 05/03/2018 0411   HGBUR NEGATIVE 05/03/2018 0411   BILIRUBINUR NEGATIVE 05/03/2018 0411   KETONESUR 5 (A) 05/03/2018 0411   PROTEINUR NEGATIVE 05/03/2018 0411   UROBILINOGEN 1.0 02/11/2011 1051   NITRITE NEGATIVE 05/03/2018 0411   LEUKOCYTESUR NEGATIVE 05/03/2018 0411   Sepsis Labs: Invalid input(s): PROCALCITONIN, LACTICIDVEN  Recent Results (from the past 240 hour(s))  MRSA PCR Screening     Status: None   Collection Time: 05/03/18  5:05 AM  Result Value Ref Range Status   MRSA by PCR NEGATIVE NEGATIVE Final    Comment:        The GeneXpert MRSA Assay (FDA approved for NASAL specimens only), is one component of a comprehensive MRSA  colonization surveillance program. It is not intended to diagnose MRSA infection nor to guide or monitor treatment for MRSA infections. Performed at [facility], [place] [address], [place], [place] [address]       Radiology Studies: Dg Chest 2 View  Result Date: 05/10/2018 CLINICAL DATA:  Metastatic thyroid cancer. Pneumonia. EXAM: CHEST - 2 VIEW COMPARISON:  Chest CT 02/15/2018 FINDINGS: Numerous large masses throughout both lungs. Cardiomediastinal size is normal. No discrete airspace consolidation identified. Large callus of the right clavicle is unchanged. Osseous lesions are better characterized on prior CT. There is extensive erosion of the left scapula. IMPRESSION: Extensive metastatic disease to the lungs and left scapula. No superimposed acute airspace disease. Electronically Signed   By:  Ulyses Jarred M.D.   On: 05/18/2018 22:22    Christoffer Currier T. [facility] Pager [phone]  If 7PM-7AM, please contact night-coverage [email or web address] Password [email or web address] 05/03/2018, 9:03 AM

## 2018-05-03 NOTE — Consult Note (Signed)
..   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 Hemodynamically stable admitted by Hospitalist. PCCM consulted for guidance in mgmt given her complex history.  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  Per EDP documentation: Patient stated that she was seen and evaluated by her PCP on 04/29/2018.  She states that she was not diagnosed with pneumonia.  She was given an antibiotic which she will complete today, a cough suppressant and Tylenol.  She continues to have cough and chest pain that is worse with coughing.  Cough is intermittently productive with mucus.  She denies history of similar chest pain in the past.  Denies any fevers but does have sick contacts with similar symptoms at home including her grandson.  Denies any recent travel, leg swelling, history of DVT or PE, abdominal pain. Of note patient was admitted to hospital at the end of December 2019.  She had similar symptoms then, VQ scan was done which was inconclusive.  However she was negative for DVT   Initial Vitals: BP (!) 143/89 (BP Location: Right Arm)   Pulse (!) 111   Temp 98.6 F (37 C) (Oral)   Resp (!) 25   Ht 5' (1.524 m)   Wt 48.5 kg   SpO2 92%   BMI 20.90 kg/m    Past Medical History  .Marland Kitchen Active Ambulatory Problems    Diagnosis Date Noted  . Cough with hemoptysis 04/23/2012  . Thyroid cancer ([place]) 04/23/2012  . Lung metastases ([place]) 04/23/2012  . Respiratory distress 02/14/2018  . Pathologic thoracic fracture 02/15/2018    . Primary cancer of thyroid gland metastatic to bone ([place]) 02/15/2018  . Normocytic normochromic anemia 02/15/2018  . Hypothyroidism 02/15/2018  . Palliative care encounter 03/24/2018   Resolved Ambulatory Problems    Diagnosis Date Noted  . No Resolved Ambulatory Problems   Past Medical History:  Diagnosis Date  . Thyroid disease    H/o skeletal muscle invasion of met H/o b/l pulmonary nodules S/p bronchial artery embolization in 2005 Lytic lesion of left humerus 2012 Pathological fracture of r hip s/p repair in 2014-2015 Denosumab stopped by pt due to hemoptysis- restarted in 2018 11/2017 Lenvatinib restarted but held 12/31/17- last dose was on 05/09/2018 12/31/17 Initiation of palliative XRT to T10/L3  Consults:  PCCM 05/03/2018  Procedures:  Started on NIPPV  Significant Diagnostic Tests:  Significant Labs on 05/18/2018: LA: 8.7  AG 17 bicarb 21 GFR >60 D dimer 1.94 Hgb 9.7 Hct 33 Plts 254 WBC 4.9  12/20/13 PET/CT interval progression in diffuse osseous mets with increase in size of soft tissue component of multiple lesions and development of new lesions involving the L skull base and T10 vertebral body. Redemonstration of increased radiotracer uptake involving the posterior parietal skull/parietal lobe. Diffuse bilateral pulm mets which are stable to slightly increased in size.  05/09/17 MRI pelvis no disease in A/P. Similar osseous mets. T10 and L3. Extensive bilateral pulmonary mets.  12/03/17 CT C/A/P slightly  increased widespread pulmonary mets. Similar severe widespread osseous mets with epidural extension at T10.   05/29/2018 CXR- 2 view FINDINGS: Numerous large masses throughout both lungs. Cardiomediastinal size is normal. No discrete airspace consolidation identified. Large callus of the right clavicle is unchanged. Osseous lesions are better characterized on prior CT. There is extensive erosion of the left scapula. IMPRESSION: Extensive metastatic disease to the  lungs and left scapula. No superimposed acute airspace disease  Micro Data:  Blood cx pending  Antimicrobials:  Vancomycin 05/12/2018- 1 dose Cefepime- 05/19/2018 - 1 dose   Objective   Blood pressure (!) 151/94, pulse (!) 124, temperature 98.6 F (37 C), temperature source Oral, resp. rate (!) 35, height 5' (1.524 m), weight 48.5 kg, SpO2 94 %.       No intake or output data in the 24 hours ending 05/03/18 0219 Filed Weights   05/07/2018 2151  Weight: 48.5 kg    Examination: General: Not in acute distress HEENT:       Eyes: PERRL, EOMI, no scleral icterus.       ENT: No discharge from the ears and nose, no pharynx injection, no tonsillar enlargement.        Neck: No JVD, no bruit, tender on palpation asymmetric swelling Cardiac: S1/S2, increased rate Noappreciable murmur or rub Respiratory: Good air movement bilaterally. No rhonci.+small polyphonic wheeze GI: Soft, nondistended, nontender, no rebound pain, no organomegaly, BS present. GU: No hematuria Ext: No pitting leg edema bilaterally. 2+DP/PT pulse bilaterally. Musculoskeletal: No joint deformities, No joint redness or warmth, no limitation of ROM in spin. Skin: No rashes.  Neuro: Alert, oriented X3, cranial nerves II-XII grossly intact, moves all extremities n  Assessment & Plan:  1. 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  First diagnosed in 1997 Follows with [place]  per documentation has 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 s/p palliative XRT continues on palliative chemo Pt has had extensive treatment and h/o hemoptysis requiring bronchoscopy and Bronchial artery embolization Her current clinical presentation is concerning for end stage cancer - poor prognosis ABG: 8.93/73/42/87- Metabolic acidosis with respiratory alkalosis. Recommend: Palliative consult - had an extensive conversation regarding clinical status and GOC pt is DNI but still wants CPR we spoke about  comfort measures and palliative care and she understands and wants to discuss it with her family. Continue on supplemental O2 as needed goal O2 sat >90%    2. Low index of suspicion for PE - D dimer elevation not reliable in the setting of metastatic CA  - pleuritic chest pain very tender around sternum Pt can not receive contrast due to allergy noted when she had her restaging scans Recommend: Lower ext dopplers VQ scan Would hold anticoagulation given her history of hemoptysis and extensive metastasis risk of bleeding outweighs benefit of starting IV anticoagulation  3. R/O Pneumonia Per Chart Review documentation from [facility] she had a recent admission for penumonia Received empiric abx with vancomycin and cefepime in [facility] CXR appears to be metastatic spread rather than multifocal penumonia Influenza panel negtive Does not appear to be septic on this admission Recommend: F/u Blood cx, resp cx Trend WBC, fever curve No additional abx needed at this time  Best practice:  Diet: NPO for now, if resp status improves consider diet Pain/Anxiety/Delirium protocol (if indicated): no continuous infusions indicated at this time, caution with sedative medications VAP protocol (if indicated): pt not intubated DVT prophylaxis: heparin [place] and SCDs ok GI prophylaxis: per primary Glucose control:  if BG exceeds 114m/dl consider ISS Mobility: bedrest q2 turn Code Status: DNI but ok with CPR and ACLS needs f/u discussion about goals of care given her prognosis Family Communication: at bedside Disposition: admitted to Hospitalist   Labs   CBC: Recent Labs  Lab 05/26/2018 2212  WBC 4.9  NEUTROABS 3.3  HGB 9.7*  HCT 33.0*  MCV 92.7  PLT 2893   Basic Metabolic Panel: Recent Labs  Lab 05/23/2018 2212  NA 141  K 4.4  CL 103  CO2 21*  GLUCOSE 108*  BUN 17  CREATININE 0.51  CALCIUM 9.8   GFR: Estimated Creatinine Clearance: 61.1 mL/min (by C-G formula based on SCr of 0.51  mg/dL). Recent Labs  Lab 05/19/2018 2154 05/28/2018 2212  WBC  --  4.9  LATICACIDVEN 8.7*  --     Liver Function Tests: Recent Labs  Lab 05/21/2018 2212  AST 82*  ALT 16  ALKPHOS 86  BILITOT 0.4  PROT 7.1  ALBUMIN 3.5   No results for input(s): LIPASE, AMYLASE in the last 168 hours. No results for input(s): AMMONIA in the last 168 hours.  ABG No results found for: PHART, PCO2ART, PO2ART, HCO3, TCO2, ACIDBASEDEF, O2SAT   Coagulation Profile: Recent Labs  Lab 05/19/2018 2212  INR 1.2    Cardiac Enzymes: No results for input(s): CKTOTAL, CKMB, CKMBINDEX, TROPONINI in the last 168 hours.  HbA1C: No results found for: HGBA1C  CBG: No results for input(s): GLUCAP in the last 168 hours.  Review of Systems:    General: no fevers, chills, no body weight gain, has poor appetite, has fatigue HEENT: no blurry vision, hearing changes or sore throat Respiratory: no dyspnea, coughing, wheezing, shortness of breath,  CV: reproducible chest pain, no palpitations GI: no nausea, vomiting, abdominal pain, diarrhea, constipation GU: no dysuria, burning on urination, increased urinary frequency, hematuria  Ext: no leg edema Neuro: no unilateral weakness, numbness, or tingling, no vision change or hearing loss Skin: no rash, no skin tear. MSK: No muscle spasm, no deformity, no limitation of range of movement in spin Heme: No easy bruising.  Travel history: No recent long distant travel.  Past Medical History  She,  has a past medical history of Thyroid cancer ([place] and Thyroid disease.   Surgical History    Past Surgical History:  Procedure Laterality Date  . CESAREAN SECTION    . THYROIDECTOMY       Social History   reports that she has never smoked. She has never used smokeless tobacco. She reports that she does not drink alcohol or use drugs.   Family History   Her family history is not on file.   Allergies Allergies  Allergen Reactions  . Iohexol Hives, Itching and  Swelling    Patient is allergic to IV dye and has pre-meds and still allergic to dye;so she was told not to ever have it again.  ak  . Shellfish Allergy Anaphylaxis  . Ace Inhibitors Cough     Home Medications  Prior to Admission medications   Medication Sig Start Date End Date Taking? Authorizing Provider  benzonatate (TESSALON) 100 MG capsule Take 100 mg by mouth 3 (three) times daily as needed for cough.  04/29/18  Yes [provider]  dexamethasone (DECADRON) 4 MG tablet Take 4 mg by mouth 4 (four) times daily. 03/19/18  Yes [provider]  gabapentin (NEURONTIN) 100 MG capsule Take 100 mg by mouth 3 (three) times daily. 11/12/17 11/12/18 Yes [provider]  ipratropium-albuterol (DUONEB) 0.5-2.5 (3) MG/3ML SOLN Take 3 mLs by nebulization every 6 (six) hours. 02/18/18  Yes [name], MD  levothyroxine (SYNTHROID, LEVOTHROID) 175 MCG tablet Take 175 mcg by mouth every morning.    Yes [provider]  moxifloxacin (AVELOX) 400 MG tablet Take 400 mg by mouth daily. 04/29/18  Yes [provider]  oxyCODONE (OXY IR/ROXICODONE) 5 MG immediate release tablet Take 5 mg by mouth every 5 (five) hours as needed for severe pain.    Yes [provider]  Phenyleph-Doxylamine-DM-APAP (TYLENOL COLD MULTI-SYMPTOM) 5-6.25-10-325 MG/15ML LIQD Take 15 mLs by mouth daily as needed (cough).   Yes [provider]  polyethylene glycol (MIRALAX / GLYCOLAX) packet Take 17 g by mouth daily as needed for constipation. 05/11/17  Yes [provider]  predniSONE (DELTASONE) 10 MG tablet Take 3 tablets for the first 3 days then 2 tablets for the following 3 days and then 1 tablet daily till done Patient not taking: Reported on 05/03/2018 02/18/18   [name], MD     Critical care time: 45 mins      I, Dr Seward Carol have personally reviewed patient's available data, including medical history, events of note, physical  examination and test results as part of my evaluation. I have discussed withother care providers such as RN and Hospitalist  In addition,  I personally evaluated patient The patient has advanced metastatic disease and may be admitted to SDU under Hospitalist. Strongly recommend goal of care conversation with palliative. Pulmonary Critical Care Consult Time devoted to patient care services described in this note is 45 Minutes. This time reflects time of care of this signee Dr Seward Carol. This critical care time does not reflect procedure time, or teaching time or supervisory time but could involve care discussion time   Dr. Seward Carol Pulmonary Critical Care Medicine  05/03/2018 3:52 AM

## 2018-05-03 NOTE — H&P (Addendum)
History and Physical    Kendra Mendez MEQ:[id number] DOB: [birth year] DOA: 05/19/2018  Referring MD/NP/PA:   PCP: System, Provider Not In   Patient coming from:  The patient is coming from home.  At baseline, pt is independent for most of ADL.        Chief Complaint: SOB and chest pain  HPI: Kendra Mendez is a 48 y.o. 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  Patient states that she has been having shortness of breath for almost 2 weeks, which is progressively worsening.  She has cough with yellow-colored, sometimes greenish colored sputum production.  No fever or chills.  She also reports central pleuritic chest pain, which is constant, 10 out of 10 severity, sharp, nonradiating, aggravated by deep breath.  Patient denies nausea vomiting, diarrhea, abdominal pain, symptoms of UTI or unilateral weakness. Pt states that she was seen and evaluated by her PCP on 04/29/2018 and was given an antibiotic (Aveox on her med list), which she will complete today. Of note patient was admitted to hospital at the end of December 2019. She had similar symptoms then, VQ scan was done which was inconclusive. Doppler was negative for DVT.   ED Course: pt was found to have WBC 4.9, positive d-dimer 1.94, lactic acid 8.7, 6.7, INR 1.2, ABG (pH 7.390, CO2 32.6, PO2 85.6), negative troponin, negative urinalysis, electrolytes renal function okay, temperature normal, tachycardia, tachypnea, oxygen saturation 90% on room air. Pt is admitted to SDU as inpt. Dr. Gilford Raid of PCCM was consulted.  # CXR showed extensive metastatic disease to the lungs and left scapula. No superimposed acute airspace disease.  Review of Systems:   General: no fevers, chills, no body weight gain, has poor appetite, has fatigue HEENT: no blurry  vision, hearing changes or sore throat Respiratory: has dyspnea, coughing, no wheezing CV: has chest pain, no palpitations. GI: no nausea, vomiting, abdominal pain, diarrhea, constipation GU: no dysuria, burning on urination, increased urinary frequency, hematuria  Ext: has mild ankle edema bilaterally. Neuro: no unilateral weakness, numbness, or tingling, no vision change or hearing loss Skin: no rash, no skin tear. MSK: No muscle spasm, no deformity, no limitation of range of movement in spin Heme: No easy bruising.  Travel history: No recent long distant travel.  Allergy:  Allergies  Allergen Reactions  . Iohexol Hives, Itching and Swelling    Patient is allergic to IV dye and has pre-meds and still allergic to dye;so she was told not to ever have it again.  ak  . Shellfish Allergy Anaphylaxis  . Ace Inhibitors Cough    Past Medical History:  Diagnosis Date  . Thyroid cancer ([place])    Metastatic to lungs and bone  . Thyroid disease     Past Surgical History:  Procedure Laterality Date  . CESAREAN SECTION    . THYROIDECTOMY      Social History:  reports that she has never smoked. She has never used smokeless tobacco. She reports that she does not drink alcohol or use drugs.  Family History:  Family History  Problem Relation Age of Onset  . Hypertension Mother   . Diabetes Mellitus II Father   . Hypertension Sister      Prior to Admission medications   Medication Sig Start Date End Date Taking? Authorizing Provider  benzonatate (  TESSALON) 100 MG capsule Take 100 mg by mouth 3 (three) times daily as needed for cough.  04/29/18  Yes [provider]  dexamethasone (DECADRON) 4 MG tablet Take 4 mg by mouth 4 (four) times daily. 03/19/18  Yes [provider]  gabapentin (NEURONTIN) 100 MG capsule Take 100 mg by mouth 3 (three) times daily. 11/12/17 11/12/18 Yes [provider]  ipratropium-albuterol (DUONEB) 0.5-2.5 (3) MG/3ML SOLN Take 3 mLs by  nebulization every 6 (six) hours. 02/18/18  Yes [name], MD  levothyroxine (SYNTHROID, LEVOTHROID) 175 MCG tablet Take 175 mcg by mouth every morning.    Yes [provider]  moxifloxacin (AVELOX) 400 MG tablet Take 400 mg by mouth daily. 04/29/18  Yes [provider]  oxyCODONE (OXY IR/ROXICODONE) 5 MG immediate release tablet Take 5 mg by mouth every 5 (five) hours as needed for severe pain.    Yes [provider]  Phenyleph-Doxylamine-DM-APAP (TYLENOL COLD MULTI-SYMPTOM) 5-6.25-10-325 MG/15ML LIQD Take 15 mLs by mouth daily as needed (cough).   Yes [provider]  polyethylene glycol (MIRALAX / GLYCOLAX) packet Take 17 g by mouth daily as needed for constipation. 05/11/17  Yes [provider]  predniSONE (DELTASONE) 10 MG tablet Take 3 tablets for the first 3 days then 2 tablets for the following 3 days and then 1 tablet daily till done Patient not taking: Reported on 05/17/2018 02/18/18   [name], MD    Physical Exam: Vitals:   05/03/18 0200 05/03/18 0316 05/03/18 0330 05/03/18 0449  BP: (!) 151/94  140/84 136/82  Pulse: (!) 124  (!) 122 (!) 114  Resp: (!) 35  (!) 30 (!) 29  Temp:    98.3 F (36.8 C)  TempSrc:    Oral  SpO2: 94% 92% 93% 97%  Weight:    51.5 kg  Height:    5' (1.524 m)   General: In acute respiratory distress. HEENT:       Eyes: PERRL, EOMI, no scleral icterus.       ENT: No discharge from the ears and nose, no pharynx injection, no tonsillar enlargement.        Neck: No JVD, no bruit, no mass felt. Heme: No neck lymph node enlargement. Cardiac: S1/S2, RRR, No murmurs, No gallops or rubs. Respiratory: Coarse breathing sound bilaterally Chest wall: pt has from chest wall tenderness on palpation. GI: Soft, nondistended, nontender, no rebound pain, no organomegaly, BS present. GU: No hematuria Ext: has mild ankle edema bilaterally. 2+DP/PT pulse bilaterally. Musculoskeletal: No joint deformities,  No joint redness or warmth, no limitation of ROM in spin. Skin: No rashes.  Neuro: Alert, oriented X3, cranial nerves II-XII grossly intact, moves all extremities normally. Psych: Patient is not psychotic, no suicidal or hemocidal ideation.  Labs on Admission: I have personally reviewed following labs and imaging studies  CBC: Recent Labs  Lab 05/10/2018 2212 05/03/18 0407  WBC 4.9  --   NEUTROABS 3.3  --   HGB 9.7* 8.9*  HCT 33.0* 29.6*  MCV 92.7  --   PLT 254  --    Basic Metabolic Panel: Recent Labs  Lab 05/01/2018 2212  NA 141  K 4.4  CL 103  CO2 21*  GLUCOSE 108*  BUN 17  CREATININE 0.51  CALCIUM 9.8   GFR: Estimated Creatinine Clearance: 61.1 mL/min (by C-G formula based on SCr of 0.51 mg/dL). Liver Function Tests: Recent Labs  Lab 05/10/2018 2212  AST 82*  ALT 16  ALKPHOS 86  BILITOT 0.4  PROT 7.1  ALBUMIN 3.5   No results for input(s): LIPASE, AMYLASE in the last 168 hours. No results for input(s): AMMONIA in the last 168 hours. Coagulation Profile: Recent Labs  Lab 05/10/2018 2212  INR 1.2   Cardiac Enzymes: No results for input(s): CKTOTAL, CKMB, CKMBINDEX, TROPONINI in the last 168 hours. BNP (last 3 results) No results for input(s): PROBNP in the last 8760 hours. HbA1C: No results for input(s): HGBA1C in the last 72 hours. CBG: No results for input(s): GLUCAP in the last 168 hours. Lipid Profile: No results for input(s): CHOL, HDL, LDLCALC, TRIG, CHOLHDL, LDLDIRECT in the last 72 hours. Thyroid Function Tests: No results for input(s): TSH, T4TOTAL, FREET4, T3FREE, THYROIDAB in the last 72 hours. Anemia Panel: No results for input(s): VITAMINB12, FOLATE, FERRITIN, TIBC, IRON, RETICCTPCT in the last 72 hours. Urine analysis:    Component Value Date/Time   COLORURINE STRAW (A) 05/03/2018 0411   APPEARANCEUR CLEAR 05/03/2018 0411   LABSPEC 1.018 05/03/2018 0411   PHURINE 5.0 05/03/2018 0411   GLUCOSEU NEGATIVE 05/03/2018 0411   HGBUR NEGATIVE  05/03/2018 0411   BILIRUBINUR NEGATIVE 05/03/2018 0411   KETONESUR 5 (A) 05/03/2018 0411   PROTEINUR NEGATIVE 05/03/2018 0411   UROBILINOGEN 1.0 02/11/2011 1051   NITRITE NEGATIVE 05/03/2018 0411   LEUKOCYTESUR NEGATIVE 05/03/2018 0411   Sepsis Labs: @LABRCNTIP (procalcitonin:4,lacticidven:4) )No results found for this or any previous visit (from the past 240 hour(s)).   Radiological Exams on Admission: Dg Chest 2 View  Result Date: 05/24/2018 CLINICAL DATA:  Metastatic thyroid cancer. Pneumonia. EXAM: CHEST - 2 VIEW COMPARISON:  Chest CT 02/15/2018 FINDINGS: Numerous large masses throughout both lungs. Cardiomediastinal size is normal. No discrete airspace consolidation identified. Large callus of the right clavicle is unchanged. Osseous lesions are better characterized on prior CT. There is extensive erosion of the left scapula. IMPRESSION: Extensive metastatic disease to the lungs and left scapula. No superimposed acute airspace disease. Electronically Signed   By: Ulyses Jarred M.D.   On: 05/07/2018 22:22     EKG: Independently reviewed.  Sinus rhythm, QTC 435, LAE, poor R wave progression, none specific T wave change.   Assessment/Plan Principal Problem:   Acute respiratory failure with hypoxia (HCC) Active Problems:   Thyroid cancer (HCC)   Normocytic normochromic anemia   Hypothyroidism   Chest pain   Lactic acidosis   Acute respiratory failure with hypoxia ([place]): likely due to extensive metastatic disease to the lungs. Other differential diagnosis include PE given positive d-dimer, pneumonia which is less likely given no leukocytosis or fever.  PCCM was consulted, Dr. Gilford Raid recommended to rule out DVT and PE. Pt can not receive contrast due to allergy noted when she had her restaging scans. Due to metastasized disease, will not restart IV heparin empirically now. Flu pcr negative. Pt was given 1 dose of vancomycin and cefepime for possible pneumonia, which will be  discontinued. Will monitor any signs of infection (fever or leukocytosis).  -Admit to stepdown C inpatient -Highly appreciated Dr. Lucile Crater recommendations -LE Venous Doppler to rule out DVT and VQ scan to rule out PE morning -Nebulizers: scheduled Atrovent and prn Xopenex Nebs -Solu-Medrol 60 mg IV bid -tessalon prn for cough  -Follow up blood culture x2 -Nasal cannula oxygen as needed to maintain O2 saturation 93% or greater -Palliative consult  Chest pain: pt has pleuritic chest pain also has chest wall tenderness, indicating musculoskeletal or costochondritis.  Troponin negative. -PRN oxycodone and morphin for pain -Follow-up VQ scan  to rule out PE  Thyroid cancer [facility]): s/pthyroidectomy, extensive pulmonary mets, mets to bones (T spinal vertebral mets, pelvic mets), s/p of bone radiation treatment, chemo therapy, and palliative radiation. Pt states that she is getting clinical chemotherapy in [facility] currently. -f/u with [facility]   Normocytic normochromic anemia:  hgb 8.9 on 02/17/18-->9.7 -f/u by CBC  Hypothyroidism: secondary, s/p of thyroidectomy -Continue Synthroid  Lactic acidosis:  lactic acid 8.7-->6.7.  Does not seem to have sepsis given no leukocytosis or fever. -IVF: 2L NS and then 75 cc/h -trend lactic acid   Inpatient status:  # Patient requires inpatient status due to high intensity of service, high risk for further deterioration and high frequency of surveillance required.  I certify that at the point of admission it is my clinical judgment that the patient will require inpatient hospital care spanning beyond 2 midnights from the point of admission.  . 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 Now patient has presenting with acute respiratory failure with hypoxia, and pleuritic chest pain . The worrisome  physical exam findings include acute respiratory distress, coarse breathing sound bilaterally on lung auscultation . The initial radiographic and laboratory data are worrisome because of extensive metastasized disease in lungs, positive d-dimer, elevated lactic acid 8.7, . Current medical needs: please see my assessment and plan . Predictability of an adverse outcome (risk): Patient has history of extensively metastasized thyroid cancer.  Now presents with acute respiratory failure with hypoxia, etiology is not clear.  Will needed to rule out PE.  Patient is at high risk for deteriorating.  Will need to be treated in the hospital for at least 2 days.   DVT ppx: SQ heparin Code Status: DNI but ok with CPR and ACLS (Dr. Gilford Raid had extensive discussion with pt, will be no intubation) Family Communication: Yes, patient's  Sister and daugher  at bed side Disposition Plan:  Anticipate discharge back to previous home environment Consults called:  PCCM, Dr. Roosvelt Harps Admission status:  SDU/inpation       Date of Service 05/03/2018    [place] [facility] [facility]   If 7PM-7AM, please contact night-coverage [email or web address] Password [facility] 05/03/2018, 5:24 AM

## 2018-05-04 ENCOUNTER — Inpatient Hospital Stay (HOSPITAL_COMMUNITY): Payer: Medicare HMO

## 2018-05-04 DIAGNOSIS — R079 Chest pain, unspecified: Secondary | ICD-10-CM

## 2018-05-04 DIAGNOSIS — D649 Anemia, unspecified: Secondary | ICD-10-CM

## 2018-05-04 LAB — URINE CULTURE: Culture: NO GROWTH

## 2018-05-04 LAB — LACTIC ACID, PLASMA
Lactic Acid, Venous: >11 mmol/L (ref 0.5–1.9)
Lactic Acid, Venous: >11 mmol/L (ref 0.5–1.9)

## 2018-05-04 LAB — BASIC METABOLIC PANEL
Anion gap: 21 — ABNORMAL HIGH (ref 5–15)
BUN: 10 mg/dL (ref 6–20)
CALCIUM: 9.3 mg/dL (ref 8.9–10.3)
CO2: 9 mmol/L — ABNORMAL LOW (ref 22–32)
Chloride: 107 mmol/L (ref 98–111)
Creatinine, Ser: 0.65 mg/dL (ref 0.44–1.00)
GFR calc non Af Amer: >60 mL/min (ref >60)
Glucose, Bld: 115 mg/dL — ABNORMAL HIGH (ref 70–99)
Potassium: 4.7 mmol/L (ref 3.5–5.1)
Sodium: 137 mmol/L (ref 135–145)

## 2018-05-04 LAB — CBC
HCT: 33.6 % — ABNORMAL LOW (ref 36.0–46.0)
Hemoglobin: 9.6 g/dL — ABNORMAL LOW (ref 12.0–15.0)
MCH: 27.7 pg (ref 26.0–34.0)
MCHC: 28.6 g/dL — ABNORMAL LOW (ref 30.0–36.0)
MCV: 96.8 fL (ref 80.0–100.0)
PLATELETS: 274 10*3/uL (ref 150–400)
RBC: 3.47 MIL/uL — ABNORMAL LOW (ref 3.87–5.11)
RDW: 18.4 % — ABNORMAL HIGH (ref 11.5–15.5)
WBC: 5.2 10*3/uL (ref 4.0–10.5)
nRBC: 0 % (ref 0.0–0.2)

## 2018-05-04 LAB — TSH: TSH: 0.156 u[IU]/mL — ABNORMAL LOW (ref 0.350–4.500)

## 2018-05-04 MED ORDER — ALBUMIN HUMAN 5 % IV SOLN
25.0000 g | Freq: Once | INTRAVENOUS | Status: AC
  Administered 2018-05-04: 24.5 g via INTRAVENOUS
  Filled 2018-05-04: qty 500

## 2018-05-04 NOTE — Consult Note (Signed)
Consultation Note Date: 05/04/2018   Patient Name: Kendra Mendez  DOB: [birth year]  MRN: [id number]  Age / Sex: 48 y.o., female  PCP: System, Provider Not In Referring Physician: Caren Griffins, MD  Reason for Consultation: Establishing goals of care  HPI/Patient Profile: 48 y.o. 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 Known metastatic disease to spine, bone, brain & lungs with prior hemoptysis s/p bronchial artery embolization.  Patient currently admitted to stepdown unit at [facility] with chest pain and shortness of breath.   She is being followed by PCCM, remains admitted under hospital medicine service.   A palliative consult has been requested for symptom management and goals of care discussions.   The patient is resting in bed. I introduced myself and palliative care as follows: Palliative medicine is specialized medical care for people living with serious illness. It focuses on providing relief from the symptoms and stress of a serious illness. The goal is to improve quality of life for both the patient and the family.  The patient wants to get up out of bed, she wants to sit in a chair. She states that she has been in bed for the past few days.   The patient is aware of the serious nature of her illness, she states that she has had cancer for a long time. She is aware of the acute current issue pertaining to this hospitalization, she knows that she has been diagnosed with DVT and efforts are being made at starting appropriate anti coagulation.   Gently discussed about the patient's overall goals of care, advanced directives planning. The patient appears distressed, she states that she hasn't thought that far. She is hopeful for ongoing  stabilization/recovery. Provided her with presence, active listening and endorsed her hopes with regards to her serious illness.   She does not have a designated HCPOA agent. She was living with her son, daughter in law and grand children. She also counts on her sister for support. 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   Reviewed her pain and non pain symptom management medications with her.    NEXT OF KIN  has son and daughter in law Has a sister  SUMMARY OF RECOMMENDATIONS    remains partial code for now Anti coagulation to be started Continue current supportive care measures Will also request chaplain support, as an extra layer of care.  Thank you for the consult, PMT to continue to follow hospital course disease trajectory and continue overall goals of care conversations.   Code Status/Advance Care Planning:  Limited code    Symptom Management:      Palliative Prophylaxis:   Delirium Protocol   Psycho-social/Spiritual:   Desire for further Chaplaincy support:yes  Additional Recommendations: Caregiving  Support/Resources  Prognosis:   Unable to determine  Discharge Planning: To Be Determined      Primary Diagnoses: Present on Admission: . Acute respiratory failure with hypoxia ([place]) . Chest pain . Thyroid cancer ([place]) . Normocytic normochromic anemia . Hypothyroidism . Lactic acidosis   I have reviewed the medical record, interviewed the patient and family, and examined the patient. The following aspects are pertinent.  Past Medical History:  Diagnosis Date  . Thyroid cancer ([place])    Metastatic to lungs and bone  . Thyroid disease    Social History   Socioeconomic History  . Marital status: Single    Spouse name: Not on file  . Number of children: Not on file  . Years of education: Not on file  . Highest education level: Not on file  Occupational History  . Not on file  Social Needs  . Financial resource  strain: Not on file  . Food insecurity:    Worry: Not on file    Inability: Not on file  . Transportation needs:    Medical: Not on file    Non-medical: Not on file  Tobacco Use  . Smoking status: Never Smoker  . Smokeless tobacco: Never Used  Substance and Sexual Activity  . Alcohol use: No  . Drug use: No  . Sexual activity: Never    Birth control/protection: None  Lifestyle  . Physical activity:    Days per week: Not on file    Minutes per session: Not on file  . Stress: Not on file  Relationships  . Social connections:    Talks on phone: Not on file    Gets together: Not on file    Attends religious service: Not on file    Active member of club or organization: Not on file    Attends meetings of clubs or organizations: Not on file    Relationship status: Not on file  Other Topics Concern  . Not on file  Social History Narrative  . Not on file   Family History  Problem Relation Age of Onset  . Hypertension Mother   . Diabetes Mellitus II Father   . Hypertension Sister    Scheduled Meds: . Chlorhexidine Gluconate Cloth  6 each Topical Daily  . gabapentin  200 mg Oral TID  . heparin injection (subcutaneous)  5,000 Units Subcutaneous Q8H  . ipratropium  0.5 mg Nebulization TID  . levalbuterol  1.25 mg Nebulization TID  . levothyroxine  175 mcg Oral Q0600  . mouth rinse  15 mL Mouth Rinse BID  . methylPREDNISolone (SOLU-MEDROL) injection  60 mg Intravenous Q12H   Continuous Infusions: . sodium chloride 125 mL/hr at 05/04/18 0916   PRN Meds:.acetaminophen **OR** acetaminophen, benzonatate, morphine injection, ondansetron **OR** ondansetron (ZOFRAN) IV, oxyCODONE, polyethylene glycol Medications Prior to Admission:  Prior to Admission medications   Medication Sig Start Date End Date Taking? Authorizing Provider  benzonatate (TESSALON) 100 MG capsule Take 100 mg by mouth 3 (three) times daily as needed for cough.  04/29/18  Yes [provider]    dexamethasone (DECADRON) 4 MG tablet Take 4 mg by mouth 4 (four) times daily. 03/19/18  Yes [provider]  gabapentin (NEURONTIN) 100 MG capsule Take 100 mg by mouth 3 (three) times daily. 11/12/17 11/12/18 Yes [provider]  ipratropium-albuterol (DUONEB) 0.5-2.5 (3) MG/3ML SOLN Take 3 mLs by nebulization every 6 (six) hours. 02/18/18  Yes [name], MD  levothyroxine (SYNTHROID, LEVOTHROID) 175 MCG tablet Take 175 mcg by mouth every morning.  Yes [provider]  moxifloxacin (AVELOX) 400 MG tablet Take 400 mg by mouth daily. 04/29/18  Yes [provider]  oxyCODONE (OXY IR/ROXICODONE) 5 MG immediate release tablet Take 5 mg by mouth every 5 (five) hours as needed for severe pain.    Yes [provider]  Phenyleph-Doxylamine-DM-APAP (TYLENOL COLD MULTI-SYMPTOM) 5-6.25-10-325 MG/15ML LIQD Take 15 mLs by mouth daily as needed (cough).   Yes [provider]  polyethylene glycol (MIRALAX / GLYCOLAX) packet Take 17 g by mouth daily as needed for constipation. 05/11/17  Yes [provider]  predniSONE (DELTASONE) 10 MG tablet Take 3 tablets for the first 3 days then 2 tablets for the following 3 days and then 1 tablet daily till done Patient not taking: Reported on 05/12/2018 02/18/18   [name], MD   Allergies  Allergen Reactions  . Iohexol Hives, Itching and Swelling    Patient is allergic to IV dye and has pre-meds and still allergic to dye;so she was told not to ever have it again.  ak  . Shellfish Allergy Anaphylaxis  . Ace Inhibitors Cough   Review of Systems + weakness  Physical Exam Mild to moderate distress Shallow regular S1 S2 Abdomen soft No edema Awake alert Flat affect  Vital Signs: BP 122/77   Pulse (!) 121   Temp 98.3 F (36.8 C) (Oral)   Resp (!) 21   Ht 5' (1.524 m)   Wt 51.5 kg   SpO2 96%   BMI 22.17 kg/m  Pain Scale: 0-10   Pain Score: 0-No pain   SpO2: SpO2: 96 % O2  Device:SpO2: 96 % O2 Flow Rate: .O2 Flow Rate (L/min): 2 L/min  IO: Intake/output summary:   Intake/Output Summary (Last 24 hours) at 05/04/2018 1404 Last data filed at 05/04/2018 0644 Gross per 24 hour  Intake 736.89 ml  Output 3025 ml  Net -2288.11 ml    LBM: Last BM Date: 05/03/18 Baseline Weight: Weight: 48.5 kg Most recent weight: Weight: 51.5 kg     Palliative Assessment/Data:   Flowsheet Rows     Most Recent Value  Intake Tab  Referral Department  Hospitalist  Unit at Time of Referral  ER  Palliative Care Primary Diagnosis  Cancer  Date Notified  05/03/18  Palliative Care Type  New Palliative care  Reason for referral  Clarify Goals of Care  Date of Admission  05/10/2018  # of days IP prior to Palliative referral  1  Clinical Assessment  Psychosocial & Spiritual Assessment  Palliative Care Outcomes     PPS 40% Time In:  1300 Time Out:  1400 Time Total:  60 Greater than 50%  of this time was spent counseling and coordinating care related to the above assessment and plan.  Signed by: Loistine Chance, MD [id number]  Please contact Palliative Medicine Team phone at [phone] for questions and concerns.  For individual provider: See Shea Evans

## 2018-05-04 NOTE — Consult Note (Signed)
..   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 Known metastatic disease to spine, bone, brain & lungs with prior hemoptysis s/p bronchial artery embolization.  Hemodynamically stable admitted by Hospitalist. PCCM consulted for guidance in mgmt given her complex history.  Past Medical History  Thyroid cancer with lung metastases  Pathologic thoracic fractures Normocytic normochromic anemia  Bilateral pulmonary nodules S/p bronchial artery embolization in 2005 Lytic lesion of left humerus 2012 Pathological fracture of R hip s/p repair in 2014-2015 Denosumab stopped by pt due to hemoptysis- restarted in 2018 11/2017 Lenvatinib restarted but held 12/31/17- last dose was on 05/10/2018 12/31/17 Initiation of palliative XRT to T10/L3   Subjective:  Tearful again when discussing DVT Otherwise no change  Consults:  PCCM 05/03/2018  Procedures:  3/02 Bipap  Significant Diagnostic Tests:  12/20/13 PET/CT interval progression in diffuse osseous mets with increase in size of soft tissue component of multiple lesions and development of new lesions involving the L skull base and T10 vertebral body. Redemonstration of increased radiotracer uptake involving the posterior parietal skull/parietal lobe. Diffuse bilateral pulm mets which are stable to slightly increased in size. 05/09/17 MRI pelvis no disease in A/P. Similar osseous mets. T10 and L3. Extensive bilateral pulmonary mets. 12/03/17 CT C/A/P slightly increased widespread pulmonary mets. Similar severe widespread osseous mets with epidural extension at T10.  2V CXR 05/15/2018 >> Numerous large masses throughout both lungs. No discrete airspace consolidation identified. Large callus of the right clavicle is  unchanged. Osseous lesions are better characterized on prior CT. There is extensive erosion of the left scapula. 3/3 LE doppler> left neg for DVT, but there is a posterior tibial vein age indeterminate DVT 3/3 V/Q scan: matched vent/perfusion deficits, intermediate probability for PE  Micro Data:  BCx2 3/2 >>  UC 3/3 >>  Antimicrobials:  Vancomycin 3/2 x1    Cefepime- 3/2 x1 dose   Objective   Blood pressure (!) 156/73, pulse (!) 123, temperature 98.3 F (36.8 C), temperature source Oral, resp. rate (!) 22, height 5' (1.524 m), weight 51.5 kg, SpO2 96 %.        Intake/Output Summary (Last 24 hours) at 05/04/2018 0959 Last data filed at 05/04/2018 4098 Gross per 24 hour  Intake 736.89 ml  Output 3875 ml  Net -3138.11 ml   Filed Weights   05/31/2018 2151 05/03/18 0449  Weight: 48.5 kg 51.5 kg    EXAM General:  Resting comfortably in bed HENT: NCAT OP clear PULM: CTA B, normal effort CV: tachycardic, no mgr GI: BS+, soft, nontender MSK: normal bulk and tone Neuro: awake, alert, no distress, MAEW   Assessment & Plan:   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 Acute DVT R posterior tibial vein P: -would favor anticoagulation (heparin first, then eliquis or Xarelto if tolerated) over IVC filter, though her bleeding risk is higher than most given her prior history of hemoptysis and spinal mets - will try to discuss with her primary oncology physician Dr. Dennison Nancy who knows her well: I called on 3/4 @ 1000, had to leave a message  Labs   CBC: Recent Labs  Lab 05/04/2018 2212 05/03/18 0407 05/03/18 0644 05/04/18 0300  WBC 4.9  --  3.8* 5.2  NEUTROABS 3.3  --   --   --   HGB 9.7* 8.9* 9.0* 9.6*  HCT 33.0* 29.6* 30.7* 33.6*  MCV 92.7  --  93.0 96.8  PLT 254  --  218 660    Basic Metabolic Panel: Recent Labs  Lab 05/14/2018 2212 05/03/18 0644 05/04/18 0300  NA 141 137 137  K 4.4 4.2 4.7  CL 103 105 107  CO2 21* 18* 9*  GLUCOSE 108* 94 115*  BUN  17 12 10   CREATININE 0.51 0.40* 0.65  CALCIUM 9.8 8.9 9.3   GFR: Estimated Creatinine Clearance: 61.1 mL/min (by C-G formula based on SCr of 0.65 mg/dL). Recent Labs  Lab 05/24/2018 2212 05/03/18 0406 05/03/18 0407 05/03/18 0644 05/04/18 0300 05/04/18 0715  PROCALCITON  --   --  1.36  --   --   --   WBC 4.9  --   --  3.8* 5.2  --   LATICACIDVEN  --  6.7*  --  7.2* >11.0* >11.0*    Liver Function Tests: Recent Labs  Lab 05/09/2018 2212  AST 82*  ALT 16  ALKPHOS 86  BILITOT 0.4  PROT 7.1  ALBUMIN 3.5   No results for input(s): LIPASE, AMYLASE in the last 168 hours. No results for input(s): AMMONIA in the last 168 hours.  ABG    Component Value Date/Time   PHART 7.390 05/03/2018 0224   PCO2ART 32.8 05/03/2018 0224   PO2ART 85.6 05/03/2018 0224   HCO3 19.4 (L) 05/03/2018 0224   ACIDBASEDEF 4.5 (H) 05/03/2018 0224   O2SAT 95.3 05/03/2018 0224     Coagulation Profile: Recent Labs  Lab 05/30/2018 2212  INR 1.2    Cardiac Enzymes: No results for input(s): CKTOTAL, CKMB, CKMBINDEX, TROPONINI in the last 168 hours.  HbA1C: No results found for: HGBA1C  CBG: No results for input(s): GLUCAP in the last 168 hours.   Critical care time: n/a    Roselie Awkward, MD [place] [facility] Pager: [phone] Cell: [phone] If no response, call [phone]   05/04/2018, 9:59 AM

## 2018-05-04 NOTE — Progress Notes (Signed)
   05/04/18 1405  Clinical Encounter Type  Visited With Patient and family together  Visit Type Spiritual support;Initial  Referral From Nurse  Consult/Referral To Chaplain  The chaplain responded to consult for Pt. spiritual care. The chaplain reviewed the Pt. chart and spoke to the Pt. RN-Amanda before entering the Pt. room.  The Pt. was sitting up at the time of chaplain visit, joined by her two sisters and mother.  The chaplain introduced herself to the Pt. and acknowledged the family presence. The Pt. stated her afternoon is improving after a morning bath and sitting up in the bed. The chaplain offered F/U spiritual care as needed.

## 2018-05-04 NOTE — Progress Notes (Addendum)
eLink Physician-Brief Progress Note Patient Name: Kendra Mendez DOB: October 27, 1970 MRN: [id number]   Date of Service  05/04/2018  HPI/Events of Note  Serum lactate 11, nausea & vomiting  eICU Interventions  5 % Albumin 500 ml iv bolus x 1 now, CT abdomen & pelvis w/o contrast to r/o acute  Abdomen. Repeat serum lactate at 7 am.        Frederik Pear 05/04/2018, 5:39 AM

## 2018-05-04 NOTE — Progress Notes (Signed)
CRITICAL VALUE ALERT  Critical Value:  Lactic Acid >11  Date & Time Notied:  05/04/18  Provider Notified: Dr. Cruzita Lederer  Orders Received/Actions taken: Received no new orders at this time.

## 2018-05-04 NOTE — Progress Notes (Signed)
PROGRESS NOTE  Kendra Mendez ZHG:[id number] DOB: 13-Apr-1970 DOA: 05/31/2018 PCP: System, Provider Not In   LOS: 1 day   Brief Narrative / Interim history: 48 year old female with history of stage IV follicular thyroid carcinoma/Hurthle cell with metastasis to the spine, bone, brain and larynx, prior history of hemoptysis requiring embolization, status post radiation therapy to T10/L3, anemia, who came in with chest pain and shortness of breath for the last couple of weeks. In the ED she had a positive d-dimer and lactic acidosis, she was placed on antibiotics empirically and admitted to the stepdown.  Critical care was consulted.  Subjective: -Complains of chest pain, similar to the one when she first came in.  Also complaining of shortness of breath.  No abdominal pain, nausea or vomiting.  Assessment & Plan: Principal Problem:   Acute respiratory failure with hypoxia (HCC) Active Problems:   Thyroid cancer (HCC)   Normocytic normochromic anemia   Hypothyroidism   Chest pain   Lactic acidosis   Principal Problem Stage IV metastatic thyroid cancer  -With significant bone and lung metastasis as well as probable epidural metastasis -Here with chest pain, probably related to her underlying metastatic disease.  Chest pain is reproducible with palpation.  EKG and troponins did not show ischemia -Followed at [facility] by Dr. Dennison Nancy, Dr. [place] Bells with critical care has placed a call to discuss with primary oncologist -Continue supportive treatment, palliative consulted and appreciate assistance with pain control as well as goals of care  Active Problems Age-indeterminate DVT -Would benefit from anticoagulation however patient has a history of massive hemoptysis requiring IR embolization past.  Has not had any bleeding in the past 2 years.  Discussed with PCCM, Dr. [place] Bells will further discuss with patient's primary oncologist regarding anticoagulation versus IVC filter -VQ scan was of  indeterminate.  Unable to do CT angiogram due to severe dye allergy despite premedication -Possibly also has underlying PE  Lactic acidosis -Unclear cause,?  Related to malignancy.  She has no liver mets, continue IV fluids, lactic acid remains persistently elevated  Anemia of chronic disease -Stable, continue monitoring  Hypothyroidism -Continue Synthroid, will check a TSH given persistent tachycardia   Scheduled Meds: . Chlorhexidine Gluconate Cloth  6 each Topical Daily  . gabapentin  200 mg Oral TID  . heparin injection (subcutaneous)  5,000 Units Subcutaneous Q8H  . ipratropium  0.5 mg Nebulization TID  . levalbuterol  1.25 mg Nebulization TID  . levothyroxine  175 mcg Oral Q0600  . mouth rinse  15 mL Mouth Rinse BID  . methylPREDNISolone (SOLU-MEDROL) injection  60 mg Intravenous Q12H   Continuous Infusions: . sodium chloride 125 mL/hr at 05/04/18 0916   PRN Meds:.acetaminophen **OR** acetaminophen, benzonatate, morphine injection, ondansetron **OR** ondansetron (ZOFRAN) IV, oxyCODONE, polyethylene glycol  DVT prophylaxis: Heparin subcu Code Status: Partial code Family Communication: No family present at bedside Disposition Plan: To be determined  Consultants:   Critical care  Procedures:   None   Antimicrobials:  None    Objective: Vitals:   05/04/18 0321 05/04/18 0731 05/04/18 0800 05/04/18 1000  BP:   (!) 156/73 (!) 151/86  Pulse:   (!) 123 (!) 124  Resp:   (!) 22 (!) 26  Temp: 98.2 F (36.8 C)  98.3 F (36.8 C)   TempSrc: Oral  Oral   SpO2:  96% 96% 97%  Weight:      Height:        Intake/Output Summary (Last 24 hours) at 05/04/2018 1040 Last data  filed at 05/04/2018 0644 Gross per 24 hour  Intake 736.89 ml  Output 3875 ml  Net -3138.11 ml   Filed Weights   05/31/2018 2151 05/03/18 0449  Weight: 48.5 kg 51.5 kg    Examination:  Constitutional: Appears to be in distress due to pain, crying Eyes: PERRL, lids and conjunctivae normal, no  scleral icterus ENMT: Mucous membranes are moist. No oropharyngeal exudates Neck: normal, supple Respiratory: Shallow breathing, tachypneic, no wheezing appreciated.  Increased respiratory effort Cardiovascular: Regular rate and rhythm, no murmurs / rubs / gallops. No LE edema. 2+ pedal pulses.  Tachycardic Abdomen: no tenderness. Bowel sounds positive.  Musculoskeletal: no clubbing / cyanosis. Skin: no rashes Neurologic: CN 2-12 grossly intact. Strength 5/5 in all 4.  Psychiatric: Normal judgment and insight. Alert and oriented x 3. Normal mood.    Data Reviewed: I have independently reviewed following labs and imaging studies   Chest x-ray -significant metastatic disease, no infiltrates noted  CBC: Recent Labs  Lab 05/29/2018 2212 05/03/18 0407 05/03/18 0644 05/04/18 0300  WBC 4.9  --  3.8* 5.2  NEUTROABS 3.3  --   --   --   HGB 9.7* 8.9* 9.0* 9.6*  HCT 33.0* 29.6* 30.7* 33.6*  MCV 92.7  --  93.0 96.8  PLT 254  --  218 536   Basic Metabolic Panel: Recent Labs  Lab 05/21/2018 2212 05/03/18 0644 05/04/18 0300  NA 141 137 137  K 4.4 4.2 4.7  CL 103 105 107  CO2 21* 18* 9*  GLUCOSE 108* 94 115*  BUN 17 12 10   CREATININE 0.51 0.40* 0.65  CALCIUM 9.8 8.9 9.3   GFR: Estimated Creatinine Clearance: 61.1 mL/min (by C-G formula based on SCr of 0.65 mg/dL). Liver Function Tests: Recent Labs  Lab 05/10/2018 2212  AST 82*  ALT 16  ALKPHOS 86  BILITOT 0.4  PROT 7.1  ALBUMIN 3.5   No results for input(s): LIPASE, AMYLASE in the last 168 hours. No results for input(s): AMMONIA in the last 168 hours. Coagulation Profile: Recent Labs  Lab 05/19/2018 2212  INR 1.2   Cardiac Enzymes: No results for input(s): CKTOTAL, CKMB, CKMBINDEX, TROPONINI in the last 168 hours. BNP (last 3 results) No results for input(s): PROBNP in the last 8760 hours. HbA1C: No results for input(s): HGBA1C in the last 72 hours. CBG: No results for input(s): GLUCAP in the last 168 hours. Lipid  Profile: No results for input(s): CHOL, HDL, LDLCALC, TRIG, CHOLHDL, LDLDIRECT in the last 72 hours. Thyroid Function Tests: No results for input(s): TSH, T4TOTAL, FREET4, T3FREE, THYROIDAB in the last 72 hours. Anemia Panel: No results for input(s): VITAMINB12, FOLATE, FERRITIN, TIBC, IRON, RETICCTPCT in the last 72 hours. Urine analysis:    Component Value Date/Time   COLORURINE STRAW (A) 05/03/2018 0411   APPEARANCEUR CLEAR 05/03/2018 0411   LABSPEC 1.018 05/03/2018 0411   PHURINE 5.0 05/03/2018 0411   GLUCOSEU NEGATIVE 05/03/2018 0411   HGBUR NEGATIVE 05/03/2018 0411   BILIRUBINUR NEGATIVE 05/03/2018 0411   KETONESUR 5 (A) 05/03/2018 0411   PROTEINUR NEGATIVE 05/03/2018 0411   UROBILINOGEN 1.0 02/11/2011 1051   NITRITE NEGATIVE 05/03/2018 0411   LEUKOCYTESUR NEGATIVE 05/03/2018 0411   Sepsis Labs: Invalid input(s): PROCALCITONIN, LACTICIDVEN  Recent Results (from the past 240 hour(s))  Urine culture     Status: None   Collection Time: 05/03/18  4:11 AM  Result Value Ref Range Status   Specimen Description   Final    URINE, CLEAN CATCH Performed at  [facility], [place] [address] [place], [place] [address]    Special Requests   Final    NONE Performed at [facility], [place] [address], [place], [place] [address]    Culture   Final    NO GROWTH Performed at [facility], [place] [address] [place], [place] [address]    Report Status 05/04/2018 FINAL  Final  MRSA PCR Screening     Status: None   Collection Time: 05/03/18  5:05 AM  Result Value Ref Range Status   MRSA by PCR NEGATIVE NEGATIVE Final    Comment:        The GeneXpert MRSA Assay (FDA approved for NASAL specimens only), is one component of a comprehensive MRSA colonization surveillance program. It is not intended to diagnose MRSA infection nor to guide or monitor treatment for MRSA infections. Performed at [facility], [place] [address],  [place], [place] [address]       Radiology Studies: Ct Abdomen Pelvis Wo Contrast  Result Date: 05/04/2018 CLINICAL DATA:  Nausea and vomiting.  Elevated serum lactic acid EXAM: CT ABDOMEN AND PELVIS WITHOUT CONTRAST TECHNIQUE: Multidetector CT imaging of the abdomen and pelvis was performed following the standard protocol without IV contrast. COMPARISON:  12/03/2003.  Abdominal CT report from [place] 12/03/2017 FINDINGS: Lower chest: Bulky pulmonary metastatic disease with staging chest CT performed 2 months ago. No superimposed acute finding. Hepatobiliary: No focal liver abnormality. Distended gallbladder without superimposed calcified stoneor inflammation. Pancreas: Atrophic appearance of the central pancreas without acute finding or ductal dilatation Spleen: Unremarkable. Adrenals/Urinary Tract: Negative adrenals. No hydronephrosis or ureteral stone. 2 mm right lower pole calculus. Unremarkable bladder. Stomach/Bowel: No obstruction. No evidence of bowel inflammation. Moderate stool retention Vascular/Lymphatic: No acute vascular finding without contrast. Atherosclerotic calcification. No mass or adenopathy. Reproductive:Negative Other: No ascites or pneumoperitoneum. Musculoskeletal: Widespread osseous metastatic disease throughout the visualized skeleton. There has been fixation of the proximal right femur with a partially visualized adjacent soft tissue mass. Chronic compression fracture of L3 with retropulsion, probable epidural tumor, and spinal stenosis-known from 03/13/2018 outside lumbar CT report at [facility]. IMPRESSION: 1. No acute finding. 2. Dilated gallbladder without superimposed inflammation or calcified stone. 3. Known widespread pulmonary and osseous metastatic disease Electronically Signed   By: Monte Fantasia M.D.   On: 05/04/2018 07:00   Dg Chest 2 View  Result Date: 05/17/2018 CLINICAL DATA:  Metastatic thyroid cancer. Pneumonia. EXAM: CHEST - 2 VIEW COMPARISON:  Chest CT 02/15/2018 FINDINGS:  Numerous large masses throughout both lungs. Cardiomediastinal size is normal. No discrete airspace consolidation identified. Large callus of the right clavicle is unchanged. Osseous lesions are better characterized on prior CT. There is extensive erosion of the left scapula. IMPRESSION: Extensive metastatic disease to the lungs and left scapula. No superimposed acute airspace disease. Electronically Signed   By: Ulyses Jarred M.D.   On: 05/25/2018 22:22   Nm Pulmonary Perf And Vent  Result Date: 05/03/2018 CLINICAL DATA:  Metastatic thyroid cancer, shortness of breath, chest pain EXAM: NUCLEAR MEDICINE VENTILATION - PERFUSION LUNG SCAN TECHNIQUE: Ventilation images were obtained in multiple projections using inhaled aerosol Tc-41m DTPA. Perfusion images were obtained in multiple projections after intravenous injection of Tc-24m MAA. Patient has an allergy to iodinated contrast material despite premedication in past. RADIOPHARMACEUTICALS:  29.7 mCi of Tc-42m DTPA aerosol inhalation and 4.1 mCi Tc81m MAA IV COMPARISON:  02/14/2018 Correlation: Chest radiographs 05/16/2018 FINDINGS: Ventilation: Central airway deposition of aerosol. Swallowed aerosol within  stomach. Poor tracer delivery via aerosol into lung parenchyma bilaterally with suboptimal assessment of ventilation. Segmental and subsegmental ventilation defects are seen at the lower lungs bilaterally and at the lateral mid RIGHT lung Perfusion: Matching areas of diminished perfusion at the lateral RIGHT lung and at both lung bases. Remaining perfusion normal. Chest radiograph: Extensive BILATERAL pulmonary metastases at the mid to lower lungs bilaterally. IMPRESSION: Matching areas of diminished ventilation and perfusion in the mid to lower lungs bilaterally. Numerous BILATERAL large pulmonary metastases of the mid to lower lungs. The presence of matching ventilation, perfusion and radiographic abnormalities represents an intermediate probability for  pulmonary embolism. Electronically Signed   By: Lavonia Dana M.D.   On: 05/03/2018 11:58   Vas [place] Lower Extremity Venous (dvt)  Result Date: 05/03/2018  Lower Venous Study Indications: Positive D-dimer. Other Indications: History of Thyroid cancer. Risk Factors: Cancer metastasis. Limitations: Body habitus and musculoskeletal features. Comparison Study: Negative BLEV study on 02/15/2018 Performing Technologist: Rudell Cobb  Examination Guidelines: A complete evaluation includes B-mode imaging, spectral Doppler, color Doppler, and power Doppler as needed of all accessible portions of each vessel. Bilateral testing is considered an integral part of a complete examination. Limited examinations for reoccurring indications may be performed as noted.  Right Venous Findings: +---------+---------------+---------+-----------+----------+-------------------+          CompressibilityPhasicitySpontaneityPropertiesSummary             +---------+---------------+---------+-----------+----------+-------------------+ CFV      Full           Yes      Yes                                      +---------+---------------+---------+-----------+----------+-------------------+ SFJ      Full                                                             +---------+---------------+---------+-----------+----------+-------------------+ FV Prox  Full                                                             +---------+---------------+---------+-----------+----------+-------------------+ FV Mid   Full                                                             +---------+---------------+---------+-----------+----------+-------------------+ FV DistalFull                                                             +---------+---------------+---------+-----------+----------+-------------------+ PFV      Full                                                              +---------+---------------+---------+-----------+----------+-------------------+  POP      Full           Yes      Yes                                      +---------+---------------+---------+-----------+----------+-------------------+ PTV      None                                         Age Indeterminate   +---------+---------------+---------+-----------+----------+-------------------+ PERO                                                  not well visualized +---------+---------------+---------+-----------+----------+-------------------+  Left Venous Findings: +---------+---------------+---------+-----------+----------+-------+          CompressibilityPhasicitySpontaneityPropertiesSummary +---------+---------------+---------+-----------+----------+-------+ CFV      Full           Yes      Yes                          +---------+---------------+---------+-----------+----------+-------+ SFJ      Full                                                 +---------+---------------+---------+-----------+----------+-------+ FV Prox  Full                                                 +---------+---------------+---------+-----------+----------+-------+ FV Mid   Full                                                 +---------+---------------+---------+-----------+----------+-------+ FV DistalFull                                                 +---------+---------------+---------+-----------+----------+-------+ PFV      Full                                                 +---------+---------------+---------+-----------+----------+-------+ POP      Full           Yes      Yes                          +---------+---------------+---------+-----------+----------+-------+ PTV      Full                                                 +---------+---------------+---------+-----------+----------+-------+  PERO     Full                                                  +---------+---------------+---------+-----------+----------+-------+    Summary: Right: Findings consistent with age indeterminate deep vein thrombosis involving the right posterior tibial vein. No cystic structure found in the popliteal fossa. Left: There is no evidence of deep vein thrombosis in the lower extremity. No cystic structure found in the popliteal fossa.  *See table(s) above for measurements and observations. Electronically signed by Deitra Mayo MD on 05/03/2018 at 3:58:56 PM.    Final     Marzetta Board, MD, PhD [facility]  Contact via  [email or web address]  [place] P: [phone]  F: [phone]

## 2018-05-05 ENCOUNTER — Encounter (HOSPITAL_COMMUNITY): Payer: Self-pay | Admitting: Diagnostic Radiology

## 2018-05-05 ENCOUNTER — Inpatient Hospital Stay (HOSPITAL_COMMUNITY): Payer: Medicare HMO

## 2018-05-05 DIAGNOSIS — Z7189 Other specified counseling: Secondary | ICD-10-CM

## 2018-05-05 DIAGNOSIS — Z515 Encounter for palliative care: Secondary | ICD-10-CM

## 2018-05-05 HISTORY — PX: IR IVC FILTER PLMT / S&I /IMG GUID/MOD SED: IMG701

## 2018-05-05 LAB — CBC
HCT: 34 % — ABNORMAL LOW (ref 36.0–46.0)
Hemoglobin: 9.6 g/dL — ABNORMAL LOW (ref 12.0–15.0)
MCH: 27.9 pg (ref 26.0–34.0)
MCHC: 28.2 g/dL — ABNORMAL LOW (ref 30.0–36.0)
MCV: 98.8 fL (ref 80.0–100.0)
Platelets: 249 10*3/uL (ref 150–400)
RBC: 3.44 MIL/uL — ABNORMAL LOW (ref 3.87–5.11)
RDW: 18.6 % — ABNORMAL HIGH (ref 11.5–15.5)
WBC: 6.4 10*3/uL (ref 4.0–10.5)
nRBC: 0 % (ref 0.0–0.2)

## 2018-05-05 LAB — COMPREHENSIVE METABOLIC PANEL
ALK PHOS: 70 U/L (ref 38–126)
ALT: 18 U/L (ref 0–44)
AST: 37 U/L (ref 15–41)
Albumin: 4.1 g/dL (ref 3.5–5.0)
Anion gap: 20 — ABNORMAL HIGH (ref 5–15)
BUN: 13 mg/dL (ref 6–20)
CO2: 9 mmol/L — ABNORMAL LOW (ref 22–32)
Calcium: 9.8 mg/dL (ref 8.9–10.3)
Chloride: 108 mmol/L (ref 98–111)
Creatinine, Ser: 0.52 mg/dL (ref 0.44–1.00)
GFR calc Af Amer: >60 mL/min (ref >60)
GFR calc non Af Amer: >60 mL/min (ref >60)
Glucose, Bld: 107 mg/dL — ABNORMAL HIGH (ref 70–99)
Potassium: 4.9 mmol/L (ref 3.5–5.1)
Sodium: 137 mmol/L (ref 135–145)
Total Bilirubin: 0.5 mg/dL (ref 0.3–1.2)
Total Protein: 7.4 g/dL (ref 6.5–8.1)

## 2018-05-05 LAB — LACTIC ACID, PLASMA: Lactic Acid, Venous: >11 mmol/L (ref 0.5–1.9)

## 2018-05-05 LAB — TROPONIN I
Troponin I: <0.03 ng/mL (ref <0.03)
Troponin I: <0.03 ng/mL (ref <0.03)
Troponin I: <0.03 ng/mL (ref <0.03)

## 2018-05-05 MED ORDER — MORPHINE SULFATE (PF) 4 MG/ML IV SOLN
4.0000 mg | INTRAVENOUS | Status: DC | PRN
  Administered 2018-05-05 – 2018-05-06 (×7): 4 mg via INTRAVENOUS
  Filled 2018-05-05 (×7): qty 1

## 2018-05-05 MED ORDER — FLUMAZENIL 0.5 MG/5ML IV SOLN
INTRAVENOUS | Status: AC
  Filled 2018-05-05: qty 5

## 2018-05-05 MED ORDER — NALOXONE HCL 0.4 MG/ML IJ SOLN
INTRAMUSCULAR | Status: AC
  Filled 2018-05-05: qty 1

## 2018-05-05 MED ORDER — OXYCODONE HCL 5 MG PO TABS
5.0000 mg | ORAL_TABLET | ORAL | Status: DC | PRN
  Administered 2018-05-05 (×3): 5 mg via ORAL
  Filled 2018-05-05 (×3): qty 1

## 2018-05-05 MED ORDER — FENTANYL CITRATE (PF) 100 MCG/2ML IJ SOLN
INTRAMUSCULAR | Status: AC
  Filled 2018-05-05: qty 2

## 2018-05-05 MED ORDER — OXYCODONE HCL 5 MG PO TABS
10.0000 mg | ORAL_TABLET | Freq: Once | ORAL | Status: AC
  Administered 2018-05-05: 10 mg via ORAL
  Filled 2018-05-05: qty 2

## 2018-05-05 MED ORDER — LIDOCAINE HCL 1 % IJ SOLN
INTRAMUSCULAR | Status: AC | PRN
  Administered 2018-05-05: 10 mL via INTRADERMAL

## 2018-05-05 MED ORDER — LIDOCAINE 5 % EX PTCH
1.0000 | MEDICATED_PATCH | CUTANEOUS | Status: DC
  Administered 2018-05-05: 1 via TRANSDERMAL
  Filled 2018-05-05 (×7): qty 1

## 2018-05-05 MED ORDER — LORAZEPAM 2 MG/ML IJ SOLN
0.5000 mg | INTRAMUSCULAR | Status: DC | PRN
  Administered 2018-05-05 – 2018-05-06 (×3): 0.5 mg via INTRAVENOUS
  Filled 2018-05-05 (×3): qty 1

## 2018-05-05 MED ORDER — LIDOCAINE HCL 1 % IJ SOLN
INTRAMUSCULAR | Status: AC
  Filled 2018-05-05: qty 20

## 2018-05-05 MED ORDER — MIDAZOLAM HCL 2 MG/2ML IJ SOLN
INTRAMUSCULAR | Status: AC
  Filled 2018-05-05: qty 2

## 2018-05-05 MED ORDER — LEVOTHYROXINE SODIUM 75 MCG PO TABS: 150.0000 ug | ORAL_TABLET | Freq: Every day | ORAL | Status: DC

## 2018-05-05 NOTE — Progress Notes (Signed)
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 In the ED she had a positive d-dimer and lactic acidosis, she was placed on antibiotics empirically and admitted to the stepdown.  Critical care was consulted.  Subjective: -continues to complain of uncontrolled chest pain, worse with palpation.  She denies any abdominal pain, nausea or vomiting.  She denies any shortness of breath  Assessment & Plan: Principal Problem:   Acute respiratory failure with hypoxia (HCC) Active Problems:   Thyroid cancer (HCC)   Normocytic normochromic anemia   Hypothyroidism   Chest pain   Lactic acidosis   Principal Problem Stage IV metastatic thyroid cancer  -With significant bone and lung metastasis as well as probable epidural metastasis at T10 -Here with chest pain, probably related to her underlying metastatic disease.  Chest pain is reproducible with palpation.  EKG and troponins did not show ischemia, however EKG this morning shows slight ST segment changes which were not on the previous EKGs.  Will cycle cardiac enzymes -Followed at [facility] by Dr. Dennison Nancy, I have called this morning and left a message -Palliative care following  Active Problems Age-indeterminate DVT -Would benefit from anticoagulation however patient has a history of massive hemoptysis requiring IR embolization past.  Has not had any bleeding in the past 2 years.  Discussed with PCCM, Dr. Lamonte Sakai.  I have placed a call to [facility] -VQ scan was of indeterminate.  Unable to do CT angiogram due to severe dye allergy  despite premedication -Possibly also has underlying PE  Lactic acidosis -Unclear cause,?  Related to malignancy.  She has no liver mets -Repeat lactic acid this morning, continue fluids  Anemia of chronic disease -Stable, continue monitoring  Hypothyroidism -Continue Synthroid, decrease dose today from 175 to 150 given suppressed TSH   Scheduled Meds: . Chlorhexidine Gluconate Cloth  6 each Topical Daily  . gabapentin  200 mg Oral TID  . heparin injection (subcutaneous)  5,000 Units Subcutaneous Q8H  . ipratropium  0.5 mg Nebulization TID  . levalbuterol  1.25 mg Nebulization TID  . levothyroxine  175 mcg Oral Q0600  . mouth rinse  15 mL Mouth Rinse BID  . methylPREDNISolone (SOLU-MEDROL) injection  60 mg Intravenous Q12H   Continuous Infusions: . sodium chloride 125 mL/hr at 05/05/18 0417   PRN Meds:.acetaminophen **OR** acetaminophen, benzonatate, morphine injection, ondansetron **OR** ondansetron (ZOFRAN) IV, oxyCODONE, polyethylene glycol  DVT prophylaxis: Heparin subcu Code Status: Partial code Family Communication: No family present at bedside Disposition Plan: To be determined  Consultants:   Critical care  Procedures:   None   Antimicrobials:  None    Objective: Vitals:   05/05/18 0400 05/05/18 0600 05/05/18 0800 05/05/18 0823  BP: 123/79 136/75 (!) 145/72   Pulse: (!) 122 (!) 123 (!) 126   Resp: 20 (!) 23 (!) 28   Temp: 98.8 F (37.1 C)  (!) 97.3 F (36.3 C)   TempSrc: Oral  Axillary   SpO2: 95% 95% 97% 98%  Weight:      Height:        Intake/Output Summary (Last 24 hours) at 05/05/2018 [phone]  Last data filed at 05/05/2018 0417 Gross per 24 hour  Intake 1125 ml  Output 1400 ml  Net -275 ml   Filed Weights   05/15/2018 2151 05/03/18 0449 05/05/18 0325  Weight: 48.5 kg 51.5 kg 48.5 kg    Examination:  Constitutional: Crying in pain Eyes: No scleral icterus ENMT: Moist mucous membranes Neck: normal, supple Respiratory: Shallow breathing,  tachypneic, no wheezing.  Increased respiratory effort Cardiovascular: Regular rate and rhythm, no murmurs, no peripheral edema.  Tachycardic Abdomen: Soft, nontender, nondistended, bowel sounds positive Musculoskeletal: no clubbing / cyanosis. Skin: No appreciable rashes Neurologic: Nonfocal, equal strength Psychiatric: Anxious   Data Reviewed: I have independently reviewed following labs and imaging studies   Chest x-ray -significant metastatic disease, no infiltrates noted  CBC: Recent Labs  Lab 05/12/2018 2212 05/03/18 0407 05/03/18 0644 05/04/18 0300 05/05/18 0240  WBC 4.9  --  3.8* 5.2 6.4  NEUTROABS 3.3  --   --   --   --   HGB 9.7* 8.9* 9.0* 9.6* 9.6*  HCT 33.0* 29.6* 30.7* 33.6* 34.0*  MCV 92.7  --  93.0 96.8 98.8  PLT 254  --  218 274 413   Basic Metabolic Panel: Recent Labs  Lab 05/10/2018 2212 05/03/18 0644 05/04/18 0300 05/05/18 0240  NA 141 137 137 137  K 4.4 4.2 4.7 4.9  CL 103 105 107 108  CO2 21* 18* 9* 9*  GLUCOSE 108* 94 115* 107*  BUN 17 12 10 13   CREATININE 0.51 0.40* 0.65 0.52  CALCIUM 9.8 8.9 9.3 9.8   GFR: Estimated Creatinine Clearance: 61.1 mL/min (by C-G formula based on SCr of 0.52 mg/dL). Liver Function Tests: Recent Labs  Lab 05/25/2018 2212 05/05/18 0240  AST 82* 37  ALT 16 18  ALKPHOS 86 70  BILITOT 0.4 0.5  PROT 7.1 7.4  ALBUMIN 3.5 4.1   No results for input(s): LIPASE, AMYLASE in the last 168 hours. No results for input(s): AMMONIA in the last 168 hours. Coagulation Profile: Recent Labs  Lab 05/10/2018 2212  INR 1.2   Cardiac Enzymes: No results for input(s): CKTOTAL, CKMB, CKMBINDEX, TROPONINI in the last 168 hours. BNP (last 3 results) No results for input(s): PROBNP in the last 8760 hours. HbA1C: No results for input(s): HGBA1C in the last 72 hours. CBG: No results for input(s): GLUCAP in the last 168 hours. Lipid Profile: No results for input(s): CHOL, HDL, LDLCALC, TRIG, CHOLHDL, LDLDIRECT in the last 72  hours. Thyroid Function Tests: Recent Labs    05/04/18 1255  TSH 0.156*   Anemia Panel: No results for input(s): VITAMINB12, FOLATE, FERRITIN, TIBC, IRON, RETICCTPCT in the last 72 hours. Urine analysis:    Component Value Date/Time   COLORURINE STRAW (A) 05/03/2018 0411   APPEARANCEUR CLEAR 05/03/2018 0411   LABSPEC 1.018 05/03/2018 0411   PHURINE 5.0 05/03/2018 0411   GLUCOSEU NEGATIVE 05/03/2018 0411   HGBUR NEGATIVE 05/03/2018 0411   BILIRUBINUR NEGATIVE 05/03/2018 0411   KETONESUR 5 (A) 05/03/2018 0411   PROTEINUR NEGATIVE 05/03/2018 0411   UROBILINOGEN 1.0 02/11/2011 1051   NITRITE NEGATIVE 05/03/2018 0411   LEUKOCYTESUR NEGATIVE 05/03/2018 0411   Sepsis Labs: Invalid input(s): PROCALCITONIN, LACTICIDVEN  Recent Results (from the past 240 hour(s))  Culture, blood (Routine x 2)     Status: None (Preliminary result)   Collection Time: 05/05/2018 10:18 PM  Result Value Ref Range Status   Specimen Description   Final    BLOOD BLOOD LEFT FOREARM Performed at [facility], [place] [address] [place], [place] [address]    Special Requests   Final    BOTTLES DRAWN AEROBIC AND ANAEROBIC Blood Culture adequate volume Performed at [place] [address], [place], [place] [address]    Culture   Final    NO GROWTH 1 DAY Performed at [facility], [place] [address], [place], [place] [address]    Report Status PENDING  Incomplete  Culture, blood (Routine x 2)     Status: None (Preliminary result)   Collection Time: 05/06/2018 11:31 PM  Result Value Ref Range Status   Specimen Description   Final    BLOOD RIGHT ANTECUBITAL Performed at [place] [address] [place], [place] [address]    Special Requests   Final    BOTTLES DRAWN AEROBIC AND ANAEROBIC Blood Culture adequate volume Performed at [place] [address] [place], [place] [address]    Culture   Final    NO GROWTH 1  DAY Performed at [facility], [place] [address] [place], [place] [address]    Report Status PENDING  Incomplete  Urine culture     Status: None   Collection Time: 05/03/18  4:11 AM  Result Value Ref Range Status   Specimen Description   Final    URINE, CLEAN CATCH Performed at [facility], [place] [address] [place], [place] [address]    Special Requests   Final    NONE Performed at [facility], [place] [address], [place], [place] [address]    Culture   Final    NO GROWTH Performed at [facility], [place] [address] [place], [place] [address]    Report Status 05/04/2018 FINAL  Final  MRSA PCR Screening     Status: None   Collection Time: 05/03/18  5:05 AM  Result Value Ref Range Status   MRSA by PCR NEGATIVE NEGATIVE Final    Comment:        The GeneXpert MRSA Assay (FDA approved for NASAL specimens only), is one component of a comprehensive MRSA colonization surveillance program. It is not intended to diagnose MRSA infection nor to guide or monitor treatment for MRSA infections. Performed at [facility], [place] [address] [place], [place] [address]       Radiology Studies: Ct Abdomen Pelvis Wo Contrast  Result Date: 05/04/2018 CLINICAL DATA:  Nausea and vomiting.  Elevated serum lactic acid EXAM: CT ABDOMEN AND PELVIS WITHOUT CONTRAST TECHNIQUE: Multidetector CT imaging of the abdomen and pelvis was performed following the standard protocol without IV contrast. COMPARISON:  12/03/2003.  Abdominal CT report from [place] 12/03/2017 FINDINGS: Lower chest: Bulky pulmonary metastatic disease with staging chest CT performed 2 months ago. No superimposed acute finding. Hepatobiliary: No focal liver abnormality. Distended gallbladder without superimposed calcified stoneor inflammation. Pancreas: Atrophic appearance of the central pancreas without acute finding or ductal dilatation Spleen: Unremarkable. Adrenals/Urinary Tract:  Negative adrenals. No hydronephrosis or ureteral stone. 2 mm right lower pole calculus. Unremarkable bladder. Stomach/Bowel: No obstruction. No evidence of bowel inflammation. Moderate stool retention Vascular/Lymphatic: No acute vascular finding without contrast. Atherosclerotic calcification. No mass or adenopathy. Reproductive:Negative Other: No ascites or pneumoperitoneum. Musculoskeletal: Widespread osseous metastatic disease throughout the visualized skeleton. There has been fixation of the proximal right femur with a partially visualized adjacent soft tissue mass. Chronic compression fracture of L3 with retropulsion, probable epidural tumor, and spinal stenosis-known from 03/13/2018 outside lumbar CT  report at [facility]. IMPRESSION: 1. No acute finding. 2. Dilated gallbladder without superimposed inflammation or calcified stone. 3. Known widespread pulmonary and osseous metastatic disease Electronically Signed   By: Monte Fantasia M.D.   On: 05/04/2018 07:00   Nm Pulmonary Perf And Vent  Result Date: 05/03/2018 CLINICAL DATA:  Metastatic thyroid cancer, shortness of breath, chest pain EXAM: NUCLEAR MEDICINE VENTILATION - PERFUSION LUNG SCAN TECHNIQUE: Ventilation images were obtained in multiple projections using inhaled aerosol Tc-96m DTPA. Perfusion images were obtained in multiple projections after intravenous injection of Tc-91m MAA. Patient has an allergy to iodinated contrast material despite premedication in past. RADIOPHARMACEUTICALS:  29.7 mCi of Tc-72m DTPA aerosol inhalation and 4.1 mCi Tc2m MAA IV COMPARISON:  02/14/2018 Correlation: Chest radiographs 05/28/2018 FINDINGS: Ventilation: Central airway deposition of aerosol. Swallowed aerosol within stomach. Poor tracer delivery via aerosol into lung parenchyma bilaterally with suboptimal assessment of ventilation. Segmental and subsegmental ventilation defects are seen at the lower lungs bilaterally and at the lateral mid RIGHT lung Perfusion:  Matching areas of diminished perfusion at the lateral RIGHT lung and at both lung bases. Remaining perfusion normal. Chest radiograph: Extensive BILATERAL pulmonary metastases at the mid to lower lungs bilaterally. IMPRESSION: Matching areas of diminished ventilation and perfusion in the mid to lower lungs bilaterally. Numerous BILATERAL large pulmonary metastases of the mid to lower lungs. The presence of matching ventilation, perfusion and radiographic abnormalities represents an intermediate probability for pulmonary embolism. Electronically Signed   By: Lavonia Dana M.D.   On: 05/03/2018 11:58   Vas [place] Lower Extremity Venous (dvt)  Result Date: 05/03/2018  Lower Venous Study Indications: Positive D-dimer. Other Indications: History of Thyroid cancer. Risk Factors: Cancer metastasis. Limitations: Body habitus and musculoskeletal features. Comparison Study: Negative BLEV study on 02/15/2018 Performing Technologist: Rudell Cobb  Examination Guidelines: A complete evaluation includes B-mode imaging, spectral Doppler, color Doppler, and power Doppler as needed of all accessible portions of each vessel. Bilateral testing is considered an integral part of a complete examination. Limited examinations for reoccurring indications may be performed as noted.  Right Venous Findings: +---------+---------------+---------+-----------+----------+-------------------+          CompressibilityPhasicitySpontaneityPropertiesSummary             +---------+---------------+---------+-----------+----------+-------------------+ CFV      Full           Yes      Yes                                      +---------+---------------+---------+-----------+----------+-------------------+ SFJ      Full                                                             +---------+---------------+---------+-----------+----------+-------------------+ FV Prox  Full                                                              +---------+---------------+---------+-----------+----------+-------------------+ FV Mid   Full                                                             +---------+---------------+---------+-----------+----------+-------------------+  FV DistalFull                                                             +---------+---------------+---------+-----------+----------+-------------------+ PFV      Full                                                             +---------+---------------+---------+-----------+----------+-------------------+ POP      Full           Yes      Yes                                      +---------+---------------+---------+-----------+----------+-------------------+ PTV      None                                         Age Indeterminate   +---------+---------------+---------+-----------+----------+-------------------+ PERO                                                  not well visualized +---------+---------------+---------+-----------+----------+-------------------+  Left Venous Findings: +---------+---------------+---------+-----------+----------+-------+          CompressibilityPhasicitySpontaneityPropertiesSummary +---------+---------------+---------+-----------+----------+-------+ CFV      Full           Yes      Yes                          +---------+---------------+---------+-----------+----------+-------+ SFJ      Full                                                 +---------+---------------+---------+-----------+----------+-------+ FV Prox  Full                                                 +---------+---------------+---------+-----------+----------+-------+ FV Mid   Full                                                 +---------+---------------+---------+-----------+----------+-------+ FV DistalFull                                                  +---------+---------------+---------+-----------+----------+-------+ PFV      Full                                                 +---------+---------------+---------+-----------+----------+-------+  POP      Full           Yes      Yes                          +---------+---------------+---------+-----------+----------+-------+ PTV      Full                                                 +---------+---------------+---------+-----------+----------+-------+ PERO     Full                                                 +---------+---------------+---------+-----------+----------+-------+    Summary: Right: Findings consistent with age indeterminate deep vein thrombosis involving the right posterior tibial vein. No cystic structure found in the popliteal fossa. Left: There is no evidence of deep vein thrombosis in the lower extremity. No cystic structure found in the popliteal fossa.  *See table(s) above for measurements and observations. Electronically signed by Deitra Mayo MD on 05/03/2018 at 3:58:56 PM.    Final     Marzetta Board, MD, PhD [facility]  Contact via  [email or web address]  [place] P: [phone]  F: [phone]

## 2018-05-05 NOTE — Progress Notes (Signed)
CRITICAL VALUE ALERT  Critical Value:  Lactic >11  Date & Time Notied:  05/05/18 0930  Provider Notified: Yes, Gherghe  Orders Received/Actions taken: none

## 2018-05-05 NOTE — Progress Notes (Signed)
RN noticed pt cardiac monitor kept ringing out with increased ST-MCL. RN got an EKG due to pt being symptomatic with increasing chest pain.RN showed it to MD Gherghe, and he ordered troponin to be drawn. Will continue to monitor and update MD when results come back.

## 2018-05-05 NOTE — Progress Notes (Addendum)
Daily Progress Note   Patient Name: Kendra Mendez       Date: 05/05/2018 DOB: Mar 05, 1970  Age: 48 y.o. MRN#: [id number] Attending Physician: Caren Griffins, MD Primary Care Physician: System, Provider Not In Admit Date: 05/01/2018  Reason for Consultation/Follow-up: Pain control  Subjective:  patient appears anxious, she is clutching her central chest area. She complains of pain, reproducible on light palpation, also made worse with deep breath.   She is tearful, she states that she did not rest well overnight. She also appears to be hyper ventilating.   She has required total 10 mg IV Morphine as well as PO 25 mg Oxycodone in the past 24 hours.   See below.   Length of Stay: 2  Current Medications: Scheduled Meds:  . Chlorhexidine Gluconate Cloth  6 each Topical Daily  . gabapentin  200 mg Oral TID  . heparin injection (subcutaneous)  5,000 Units Subcutaneous Q8H  . ipratropium  0.5 mg Nebulization TID  . levalbuterol  1.25 mg Nebulization TID  . [START ON 05/06/2018] levothyroxine  150 mcg Oral Q0600  . lidocaine  1 patch Transdermal Q24H  . mouth rinse  15 mL Mouth Rinse BID  . methylPREDNISolone (SOLU-MEDROL) injection  60 mg Intravenous Q12H    Continuous Infusions: . sodium chloride 125 mL/hr at 05/05/18 1018    PRN Meds: acetaminophen **OR** acetaminophen, benzonatate, LORazepam, morphine injection, ondansetron **OR** ondansetron (ZOFRAN) IV, oxyCODONE, polyethylene glycol  Physical Exam         Patient in moderate to severe distress this morning Chest pain Appears tachypneic as well Abdomen is soft, not distended No edema Appears anxious  Vital Signs: BP (!) 145/72   Pulse (!) 126   Temp (!) 97.3 F (36.3 C) (Axillary)   Resp (!) 28   Ht 5' (1.524 m)   Wt  48.5 kg   SpO2 98%   BMI 20.88 kg/m  SpO2: SpO2: 98 % O2 Device: O2 Device: Room Air O2 Flow Rate: O2 Flow Rate (L/min): 2 L/min  Intake/output summary:   Intake/Output Summary (Last 24 hours) at 05/05/2018 1024 Last data filed at 05/05/2018 0417 Gross per 24 hour  Intake 1125 ml  Output 1400 ml  Net -275 ml   LBM: Last BM Date: 05/03/18 Baseline Weight: Weight: 48.5 kg Most recent weight:  Weight: 48.5 kg       Palliative Assessment/Data:    Flowsheet Rows     Most Recent Value  Intake Tab  Referral Department  Hospitalist  Unit at Time of Referral  ER  Palliative Care Primary Diagnosis  Cancer  Date Notified  05/03/18  Palliative Care Type  New Palliative care  Reason for referral  Clarify Goals of Care  Date of Admission  05/30/2018  # of days IP prior to Palliative referral  1  Clinical Assessment  Psychosocial & Spiritual Assessment  Palliative Care Outcomes      Patient Active Problem List   Diagnosis Date Noted  . Acute respiratory failure with hypoxia ([place]) 05/03/2018  . Chest pain 05/03/2018  . Lactic acidosis 05/03/2018  . Palliative care encounter 03/24/2018  . Pathologic thoracic fracture 02/15/2018  . Primary cancer of thyroid gland metastatic to bone ([place]) 02/15/2018  . Normocytic normochromic anemia 02/15/2018  . Hypothyroidism 02/15/2018  . Respiratory distress 02/14/2018  . Cough with hemoptysis 04/23/2012  . Thyroid cancer ([place]) 04/23/2012  . Lung metastases ([place]) 04/23/2012    Palliative Care Assessment & Plan   Patient Profile:  48 yr old F w/ PMHx Stage IV follicular thyroid CA Hurthle cell presents to [facility] with chest pain &SOB. Known metastatic disease to spine, bone, brain &lungs with prior hemoptysis s/p bronchial artery embolization.  Patient currently admitted to stepdown unit at [facility] with chest pain and shortness of breath.   Assessment:  chest pain Tachypnea PPS 30% Anxiety Subjective sensation of dyspnea.     Recommendations/Plan:  Agree with increased dose of IV Morphine.   Will add supplemental O2, lidoderm patch and PRN IV Ativan for supportive care.   Continue IVF and current mode of care.   Appreciate chaplain consult and follow up.  Guarded prognosis.     Code Status:    Code Status Orders  (From admission, onward)         Start     Ordered   05/03/18 0431  Limited resuscitation (code)  Continuous    Question Answer Comment  In the event of cardiac or respiratory ARREST: Initiate Code Blue, Call Rapid Response Yes   In the event of cardiac or respiratory ARREST: Perform CPR Yes   In the event of cardiac or respiratory ARREST: Perform Intubation/Mechanical Ventilation No   In the event of cardiac or respiratory ARREST: Use NIPPV/BiPAp only if indicated Yes   In the event of cardiac or respiratory ARREST: Administer ACLS medications if indicated Yes   In the event of cardiac or respiratory ARREST: Perform Defibrillation or Cardioversion if indicated Yes      05/03/18 0430        Code Status History    Date Active Date Inactive Code Status Order ID Comments User Context   05/03/2018 0245 05/03/2018 0430 Full Code [id number]  Ivor Costa, MD ED   02/14/2018 2324 02/18/2018 2023 Full Code [id number]  Norval Morton, MD ED   04/23/2012 0647 04/25/2012 0111 Full Code [id number]  Orvan Falconer, MD Inpatient       Prognosis:   guarded.   Discharge Planning:  To Be Determined  Care plan was discussed with  Patient, bedside RN as well as with [facility] MD.   Thank you for allowing the Palliative Medicine Team to assist in the care of this patient.   Time In:  9.30 Time Out: 10.05 Total Time 35 Prolonged Time Billed  no  Greater than 50%  of this time was spent counseling and coordinating care related to the above assessment and plan.  Loistine Chance, MD [phone] Please contact Palliative Medicine Team phone at [phone] for questions and concerns.

## 2018-05-05 NOTE — Progress Notes (Addendum)
Chest pain 10/10 and too early to give pain meds based on orders.  Paged K. Schorr. Pain medication orders changed in Epic. Pain meds given to patient - see MAR.

## 2018-05-05 NOTE — Procedures (Signed)
Interventional Radiology Procedure:   Indications: Metastatic disease with age indeterminate leg DVT and intermediate V/Q scan. Patient refuses anticoagulation.  Procedure: IVC filter placement with carbon dioxide  Findings: IVC patent.  Bilateral renal veins identified.  [place] filter placed below renal veins  Complications: None     EBL: Minimal   Plan: Return to inpatient floor.     Aviannah Castoro R. Anselm Pancoast, MD  Pager: [phone]

## 2018-05-05 NOTE — Consult Note (Addendum)
Chief Complaint: Patient was seen in consultation today for DVT.  Referring Physician(s): Marzetta Board M  Supervising Physician: Markus Daft  Patient Status: [facility] - In-pt  History of Present Illness: Kendra Mendez is a 48 y.o. 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 She presented to [facility] ED 05/16/2018 with complain of progressive dyspnea x2 weeks and chest pain. She was admitted for management of acute respiratory failure. In ED, patient was found to have a positive d-dimer. CTA could not be preformed due to patient's contrast allergy. VQ scan was indeterminate for PE. US doppler lower extremities revealed age indeterminate DVT. CCM recommended anticoagulation, but patient has history of hemoptysis while on anticoagulation (requiring bronchostomy and IR bronchial artery embolization), so patient's primary oncologist at [facility] was called for further recommendations. Her oncologist stated either treatment was sufficient, however patient/family declined anticoagulation due to history of bleeding.  IR requested by Dr. Cruzita Lederer for possible image-guided IVCF placement. Patient awake and alert laying in bed. Accompanied by sister and son at bedside. She does not verbally communicate during my interaction with patient, but family states this is due to her extreme chest pain.   Past Medical History:  Diagnosis Date  . Thyroid cancer ([place])    Metastatic to lungs and bone  . Thyroid disease     Past Surgical History:  Procedure Laterality Date  . CESAREAN SECTION    . THYROIDECTOMY      Allergies: Iohexol; Shellfish allergy; and Ace inhibitors  Medications: Prior to Admission medications   Medication Sig Start Date End Date Taking? Authorizing Provider  benzonatate (TESSALON) 100 MG capsule Take 100 mg by mouth 3 (three) times daily as needed for cough.  04/29/18  Yes [provider]    dexamethasone (DECADRON) 4 MG tablet Take 4 mg by mouth 4 (four) times daily. 03/19/18  Yes [provider]  gabapentin (NEURONTIN) 100 MG capsule Take 100 mg by mouth 3 (three) times daily. 11/12/17 11/12/18 Yes [provider]  ipratropium-albuterol (DUONEB) 0.5-2.5 (3) MG/3ML SOLN Take 3 mLs by nebulization every 6 (six) hours. 02/18/18  Yes [name], MD  levothyroxine (SYNTHROID, LEVOTHROID) 175 MCG tablet Take 175 mcg by mouth every morning.    Yes [provider]  moxifloxacin (AVELOX) 400 MG tablet Take 400 mg by mouth daily. 04/29/18  Yes [provider]  oxyCODONE (OXY IR/ROXICODONE) 5 MG immediate release tablet Take 5 mg by mouth every 5 (five) hours as needed for severe pain.    Yes [provider]  Phenyleph-Doxylamine-DM-APAP (TYLENOL COLD MULTI-SYMPTOM) 5-6.25-10-325 MG/15ML LIQD Take 15 mLs by mouth daily as needed (cough).   Yes [provider]  polyethylene glycol (MIRALAX / GLYCOLAX) packet Take 17 g by mouth daily as needed for constipation. 05/11/17  Yes [provider]  predniSONE (DELTASONE) 10 MG tablet Take 3 tablets for the first 3 days then 2 tablets for the following 3 days and then 1 tablet daily till done Patient not taking: Reported on 05/14/2018 02/18/18   [name], MD     Family History  Problem Relation Age of Onset  . Hypertension Mother   . Diabetes Mellitus II Father   . Hypertension Sister     Social History   Socioeconomic History  . Marital status: Single    Spouse name: Not on file  . Number of children: Not on file  . Years of education: Not on  file  . Highest education level: Not on file  Occupational History  . Not on file  Social Needs  . Financial resource strain: Not on file  . Food insecurity:    Worry: Not on file    Inability: Not on file  . Transportation needs:    Medical: Not on file    Non-medical: Not on file  Tobacco Use  . Smoking status:  Never Smoker  . Smokeless tobacco: Never Used  Substance and Sexual Activity  . Alcohol use: No  . Drug use: No  . Sexual activity: Never    Birth control/protection: None  Lifestyle  . Physical activity:    Days per week: Not on file    Minutes per session: Not on file  . Stress: Not on file  Relationships  . Social connections:    Talks on phone: Not on file    Gets together: Not on file    Attends religious service: Not on file    Active member of club or organization: Not on file    Attends meetings of clubs or organizations: Not on file    Relationship status: Not on file  Other Topics Concern  . Not on file  Social History Narrative  . Not on file     Review of Systems: A 12 point ROS discussed and pertinent positives are indicated in the HPI above.  All other systems are negative.  Review of Systems  Unable to perform ROS: Mental status change    Vital Signs: BP (!) 148/77   Pulse (!) 130   Temp 97.7 F (36.5 C) (Axillary)   Resp (!) 24   Ht 5' (1.524 m)   Wt 106 lb 14.8 oz (48.5 kg)   SpO2 98%   BMI 20.88 kg/m   Physical Exam Vitals signs and nursing note reviewed.  Constitutional:      General: She is not in acute distress.    Appearance: Normal appearance.  Cardiovascular:     Rate and Rhythm: Regular rhythm. Tachycardia present.     Heart sounds: Normal heart sounds. No murmur.  Pulmonary:     Effort: Pulmonary effort is normal. No respiratory distress.     Breath sounds: Normal breath sounds. No wheezing.  Skin:    General: Skin is warm and dry.  Neurological:     Mental Status: She is alert and oriented to person, place, and time.  Psychiatric:        Mood and Affect: Mood normal.        Behavior: Behavior normal.        Thought Content: Thought content normal.        Judgment: Judgment normal.      MD Evaluation Airway: WNL Heart: WNL Abdomen: WNL Chest/ Lungs: WNL ASA  Classification: 4 Mallampati/Airway Score:  Two   Imaging: Ct Abdomen Pelvis Wo Contrast  Result Date: 05/04/2018 CLINICAL DATA:  Nausea and vomiting.  Elevated serum lactic acid EXAM: CT ABDOMEN AND PELVIS WITHOUT CONTRAST TECHNIQUE: Multidetector CT imaging of the abdomen and pelvis was performed following the standard protocol without IV contrast. COMPARISON:  12/03/2003.  Abdominal CT report from [place] 12/03/2017 FINDINGS: Lower chest: Bulky pulmonary metastatic disease with staging chest CT performed 2 months ago. No superimposed acute finding. Hepatobiliary: No focal liver abnormality. Distended gallbladder without superimposed calcified stoneor inflammation. Pancreas: Atrophic appearance of the central pancreas without acute finding or ductal dilatation Spleen: Unremarkable. Adrenals/Urinary Tract: Negative adrenals. No hydronephrosis or ureteral stone. 2 mm  right lower pole calculus. Unremarkable bladder. Stomach/Bowel: No obstruction. No evidence of bowel inflammation. Moderate stool retention Vascular/Lymphatic: No acute vascular finding without contrast. Atherosclerotic calcification. No mass or adenopathy. Reproductive:Negative Other: No ascites or pneumoperitoneum. Musculoskeletal: Widespread osseous metastatic disease throughout the visualized skeleton. There has been fixation of the proximal right femur with a partially visualized adjacent soft tissue mass. Chronic compression fracture of L3 with retropulsion, probable epidural tumor, and spinal stenosis-known from 03/13/2018 outside lumbar CT report at [facility]. IMPRESSION: 1. No acute finding. 2. Dilated gallbladder without superimposed inflammation or calcified stone. 3. Known widespread pulmonary and osseous metastatic disease Electronically Signed   By: Monte Fantasia M.D.   On: 05/04/2018 07:00   Dg Chest 2 View  Result Date: 05/01/2018 CLINICAL DATA:  Metastatic thyroid cancer. Pneumonia. EXAM: CHEST - 2 VIEW COMPARISON:  Chest CT 02/15/2018 FINDINGS: Numerous large masses throughout  both lungs. Cardiomediastinal size is normal. No discrete airspace consolidation identified. Large callus of the right clavicle is unchanged. Osseous lesions are better characterized on prior CT. There is extensive erosion of the left scapula. IMPRESSION: Extensive metastatic disease to the lungs and left scapula. No superimposed acute airspace disease. Electronically Signed   By: Ulyses Jarred M.D.   On: 05/01/2018 22:22   Nm Pulmonary Perf And Vent  Result Date: 05/03/2018 CLINICAL DATA:  Metastatic thyroid cancer, shortness of breath, chest pain EXAM: NUCLEAR MEDICINE VENTILATION - PERFUSION LUNG SCAN TECHNIQUE: Ventilation images were obtained in multiple projections using inhaled aerosol Tc-37m DTPA. Perfusion images were obtained in multiple projections after intravenous injection of Tc-57m MAA. Patient has an allergy to iodinated contrast material despite premedication in past. RADIOPHARMACEUTICALS:  29.7 mCi of Tc-49m DTPA aerosol inhalation and 4.1 mCi Tc75m MAA IV COMPARISON:  02/14/2018 Correlation: Chest radiographs 05/27/2018 FINDINGS: Ventilation: Central airway deposition of aerosol. Swallowed aerosol within stomach. Poor tracer delivery via aerosol into lung parenchyma bilaterally with suboptimal assessment of ventilation. Segmental and subsegmental ventilation defects are seen at the lower lungs bilaterally and at the lateral mid RIGHT lung Perfusion: Matching areas of diminished perfusion at the lateral RIGHT lung and at both lung bases. Remaining perfusion normal. Chest radiograph: Extensive BILATERAL pulmonary metastases at the mid to lower lungs bilaterally. IMPRESSION: Matching areas of diminished ventilation and perfusion in the mid to lower lungs bilaterally. Numerous BILATERAL large pulmonary metastases of the mid to lower lungs. The presence of matching ventilation, perfusion and radiographic abnormalities represents an intermediate probability for pulmonary embolism. Electronically  Signed   By: Lavonia Dana M.D.   On: 05/03/2018 11:58   Vas [place] Lower Extremity Venous (dvt)  Result Date: 05/03/2018  Lower Venous Study Indications: Positive D-dimer. Other Indications: History of Thyroid cancer. Risk Factors: Cancer metastasis. Limitations: Body habitus and musculoskeletal features. Comparison Study: Negative BLEV study on 02/15/2018 Performing Technologist: Rudell Cobb  Examination Guidelines: A complete evaluation includes B-mode imaging, spectral Doppler, color Doppler, and power Doppler as needed of all accessible portions of each vessel. Bilateral testing is considered an integral part of a complete examination. Limited examinations for reoccurring indications may be performed as noted.  Right Venous Findings: +---------+---------------+---------+-----------+----------+-------------------+          CompressibilityPhasicitySpontaneityPropertiesSummary             +---------+---------------+---------+-----------+----------+-------------------+ CFV      Full           Yes      Yes                                      +---------+---------------+---------+-----------+----------+-------------------+  SFJ      Full                                                             +---------+---------------+---------+-----------+----------+-------------------+ FV Prox  Full                                                             +---------+---------------+---------+-----------+----------+-------------------+ FV Mid   Full                                                             +---------+---------------+---------+-----------+----------+-------------------+ FV DistalFull                                                             +---------+---------------+---------+-----------+----------+-------------------+ PFV      Full                                                              +---------+---------------+---------+-----------+----------+-------------------+ POP      Full           Yes      Yes                                      +---------+---------------+---------+-----------+----------+-------------------+ PTV      None                                         Age Indeterminate   +---------+---------------+---------+-----------+----------+-------------------+ PERO                                                  not well visualized +---------+---------------+---------+-----------+----------+-------------------+  Left Venous Findings: +---------+---------------+---------+-----------+----------+-------+          CompressibilityPhasicitySpontaneityPropertiesSummary +---------+---------------+---------+-----------+----------+-------+ CFV      Full           Yes      Yes                          +---------+---------------+---------+-----------+----------+-------+ SFJ      Full                                                 +---------+---------------+---------+-----------+----------+-------+  FV Prox  Full                                                 +---------+---------------+---------+-----------+----------+-------+ FV Mid   Full                                                 +---------+---------------+---------+-----------+----------+-------+ FV DistalFull                                                 +---------+---------------+---------+-----------+----------+-------+ PFV      Full                                                 +---------+---------------+---------+-----------+----------+-------+ POP      Full           Yes      Yes                          +---------+---------------+---------+-----------+----------+-------+ PTV      Full                                                 +---------+---------------+---------+-----------+----------+-------+ PERO     Full                                                  +---------+---------------+---------+-----------+----------+-------+    Summary: Right: Findings consistent with age indeterminate deep vein thrombosis involving the right posterior tibial vein. No cystic structure found in the popliteal fossa. Left: There is no evidence of deep vein thrombosis in the lower extremity. No cystic structure found in the popliteal fossa.  *See table(s) above for measurements and observations. Electronically signed by Deitra Mayo MD on 05/03/2018 at 3:58:56 PM.    Final     Labs:  CBC: Recent Labs    05/31/2018 2212 05/03/18 0407 05/03/18 0644 05/04/18 0300 05/05/18 0240  WBC 4.9  --  3.8* 5.2 6.4  HGB 9.7* 8.9* 9.0* 9.6* 9.6*  HCT 33.0* 29.6* 30.7* 33.6* 34.0*  PLT 254  --  218 274 249    COAGS: Recent Labs    05/01/2018 2212  INR 1.2    BMP: Recent Labs    05/19/2018 2212 05/03/18 0644 05/04/18 0300 05/05/18 0240  NA 141 137 137 137  K 4.4 4.2 4.7 4.9  CL 103 105 107 108  CO2 21* 18* 9* 9*  GLUCOSE 108* 94 115* 107*  BUN 17 12 10 13   CALCIUM 9.8 8.9 9.3 9.8  CREATININE 0.51 0.40* 0.65 0.52  GFRNONAA >60 >60 >60 >60  GFRAA >60 >60 >60 >60    LIVER FUNCTION TESTS: Recent Labs  02/14/18 1557 05/16/2018 2212 05/05/18 0240  BILITOT 0.8 0.4 0.5  AST 30 82* 37  ALT 13 16 18   ALKPHOS 70 86 70  PROT 7.7 7.1 7.4  ALBUMIN 4.2 3.5 4.1     Assessment and Plan:  DVT. Plan for image-guided IVCF placement today with Dr. Anselm Pancoast. Patient is NPO. Afebrile and WBCs WNL. She does not take blood thinners. INR 1.2 seconds 05/21/2018.  Risks and benefits discussed with the patient including, but not limited to bleeding, infection, contrast induced renal failure, filter fracture or migration which can lead to emergency surgery or even death, strut penetration with damage or irritation to adjacent structures and caval thrombosis. All of the patient's questions were answered, patient is agreeable to proceed. Consent signed by  son and in chart.   Thank you for this interesting consult.  I greatly enjoyed meeting Kendra Mendez and look forward to participating in their care.  A copy of this report was sent to the requesting provider on this date.  Electronically Signed: Earley Abide, PA-C 05/05/2018, 4:20 PM   I spent a total of 40 Minutes in face to face in clinical consultation, greater than 50% of which was counseling/coordinating care for DVT.

## 2018-05-05 NOTE — Progress Notes (Signed)
Patient moaning in pain. C/o 10/10 chest pain. Tachycardic to the 130s. PRN morphine given at 2011 and PRN oxycodone given at 2149. Schorr, NP paged regarding the situation.

## 2018-05-06 ENCOUNTER — Inpatient Hospital Stay (HOSPITAL_COMMUNITY): Payer: Medicare HMO

## 2018-05-06 DIAGNOSIS — R012 Other cardiac sounds: Secondary | ICD-10-CM

## 2018-05-06 DIAGNOSIS — Z515 Encounter for palliative care: Secondary | ICD-10-CM

## 2018-05-06 LAB — BLOOD GAS, ARTERIAL
ACID-BASE DEFICIT: 14.1 mmol/L — AB (ref 0.0–2.0)
Bicarbonate: 12.5 mmol/L — ABNORMAL LOW (ref 20.0–28.0)
Drawn by: 257881
O2 Content: 2 L/min
O2 Saturation: 94.7 %
Patient temperature: 98.6
pCO2 arterial: 32.9 mmHg (ref 32.0–48.0)
pH, Arterial: 7.204 — ABNORMAL LOW (ref 7.350–7.450)
pO2, Arterial: 85.8 mmHg (ref 83.0–108.0)

## 2018-05-06 LAB — CBC
HCT: 33.1 % — ABNORMAL LOW (ref 36.0–46.0)
Hemoglobin: 9.5 g/dL — ABNORMAL LOW (ref 12.0–15.0)
MCH: 28 pg (ref 26.0–34.0)
MCHC: 28.7 g/dL — ABNORMAL LOW (ref 30.0–36.0)
MCV: 97.6 fL (ref 80.0–100.0)
Platelets: 261 10*3/uL (ref 150–400)
RBC: 3.39 MIL/uL — ABNORMAL LOW (ref 3.87–5.11)
RDW: 18.8 % — ABNORMAL HIGH (ref 11.5–15.5)
WBC: 7.3 10*3/uL (ref 4.0–10.5)
nRBC: 0.5 % — ABNORMAL HIGH (ref 0.0–0.2)

## 2018-05-06 LAB — BASIC METABOLIC PANEL
Anion gap: 18 — ABNORMAL HIGH (ref 5–15)
BUN: 21 mg/dL — ABNORMAL HIGH (ref 6–20)
CO2: 12 mmol/L — ABNORMAL LOW (ref 22–32)
Calcium: 9.6 mg/dL (ref 8.9–10.3)
Chloride: 106 mmol/L (ref 98–111)
Creatinine, Ser: 0.59 mg/dL (ref 0.44–1.00)
GFR calc Af Amer: >60 mL/min (ref >60)
GFR calc non Af Amer: >60 mL/min (ref >60)
Glucose, Bld: 87 mg/dL (ref 70–99)
Potassium: 5.6 mmol/L — ABNORMAL HIGH (ref 3.5–5.1)
Sodium: 136 mmol/L (ref 135–145)

## 2018-05-06 LAB — ECHOCARDIOGRAM COMPLETE
Height: 60 in
WEIGHTICAEL: 1710.77 [oz_av]

## 2018-05-06 MED ORDER — LORAZEPAM 2 MG/ML IJ SOLN: 1.0000 mg | INTRAMUSCULAR | Status: DC | PRN

## 2018-05-06 MED ORDER — LIP MEDEX EX OINT
TOPICAL_OINTMENT | CUTANEOUS | Status: AC
  Administered 2018-05-06: 23:00:00
  Filled 2018-05-06: qty 7

## 2018-05-06 MED ORDER — MORPHINE BOLUS VIA INFUSION
1.0000 mg | INTRAVENOUS | Status: DC | PRN
  Administered 2018-05-08: 1 mg via INTRAVENOUS
  Filled 2018-05-06: qty 1

## 2018-05-06 MED ORDER — MORPHINE 100MG IN NS 100ML (1MG/ML) PREMIX INFUSION
2.0000 mg/h | INTRAVENOUS | Status: DC
  Administered 2018-05-06 – 2018-05-08 (×2): 2 mg/h via INTRAVENOUS
  Filled 2018-05-06 (×3): qty 100

## 2018-05-06 NOTE — Progress Notes (Signed)
Nurse called for chaplain to come be with family as patient is transitioning to comfort care. No longer responsive.  Had prayer with family-son, mother and others.  Family said she looks like she is resting -no screaming out in pain like she was earlier.  Prayers for peace in heart and mind. JANALYN HIGBY, Chaplain   05/06/18 1100  Clinical Encounter Type  Visited With Patient and family together  Visit Type Spiritual support;Critical Care  Referral From Nurse  Consult/Referral To Chaplain  Spiritual Encounters  Spiritual Needs Prayer  Stress Factors  Patient Stress Factors Health changes;Other (Comment) (comfort care)  Family Stress Factors Health changes;Other (Comment) (comfort care)

## 2018-05-06 NOTE — Progress Notes (Signed)
Family wanted to anoint patient as she was very spiritual.  After blessing over the oil and annointing forehead, her children anointed her feet and hands and prayed over her as she was declining and pain meds helping her not be in pain.SHANIQUIA BRAFFORD, Chaplain   05/06/18 1600  Clinical Encounter Type  Visited With Patient and family together  Visit Type Spiritual support;Critical Care  Referral From Family  Consult/Referral To Chaplain  Spiritual Encounters  Spiritual Needs Emotional;Other (Comment) (bless anointing oil and annoint patient)

## 2018-05-06 NOTE — Progress Notes (Signed)
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 In the ED she had a positive d-dimer and lactic acidosis, she was placed on antibiotics empirically and admitted to the stepdown.  Critical care was consulted.  Subjective: -Patient seems to be much more lethargic this morning, has complained of significant excruciating 10 out of 10 pain of her chest all night long requiring multiple doses of morphine.  She is able to respond to her name open her eyes and cries in pain  Assessment & Plan: Principal Problem:   Acute respiratory failure with hypoxia ([place]) Active Problems:   Thyroid cancer ([place])   Normocytic normochromic anemia   Hypothyroidism   Chest pain   Lactic acidosis   Palliative care by specialist   Goals of care, counseling/discussion   Dying care   Principal Problem Stage IV metastatic thyroid cancer  -With significant bone and lung metastasis as well as probable epidural metastasis at T10 -Here with chest pain, probably related to her underlying metastatic disease.  Chest pain is reproducible with palpation.  EKG and troponins did not show ischemia, serial cardiac enzymes and EKG did not show any active ischemia -Followed at [facility] by Dr. Dennison Nancy, I have discussed with her over the phone -Her disease seems to be progressing significantly, she has impressive tumor burden with lactic acidosis, excruciating pain, tachycardia and tachypnea -She is declining despite treatment and it appears that she is entering the dying process.  She has progressively gotten worse  over the last few days, discussed with palliative care as well as family at bedside and seems like we are going to transition towards comfort measures  Active Problems Age-indeterminate DVT -Would benefit from anticoagulation however patient has a history of massive hemoptysis requiring IR embolization past.  Has not had any bleeding in the past 2 years.  Discussed with PCCM, Dr. Lamonte Sakai.  -VQ scan was of indeterminate.  Unable to do CT angiogram due to severe dye allergy despite premedication -Possibly also has underlying PE, risks and benefits of anticoagulation versus IVC filter were discussed with patient, she declined anticoagulation and is status post IVC filter per IR  Lactic acidosis -Likely related to aggressive tumor burden/malignancy  Anemia of chronic disease -Stable  Hypothyroidism -On Synthroid   Scheduled Meds: . Chlorhexidine Gluconate Cloth  6 each Topical Daily  . heparin injection (subcutaneous)  5,000 Units Subcutaneous Q8H  . levalbuterol  1.25 mg Nebulization TID  . lidocaine  1 patch Transdermal Q24H  . mouth rinse  15 mL Mouth Rinse BID  . methylPREDNISolone (SOLU-MEDROL) injection  60 mg Intravenous Q12H   Continuous Infusions: . sodium chloride 10 mL/hr at 05/06/18 1013  . morphine     PRN Meds:.acetaminophen **OR** acetaminophen, LORazepam, morphine **AND** morphine, ondansetron **OR** ondansetron (ZOFRAN) IV  DVT prophylaxis: Heparin subcu Code Status: Partial code Family Communication: Mother and sister present at bedside Disposition Plan: To be determined  Consultants:   Critical care  Procedures:   None   Antimicrobials:  None    Objective: Vitals:   05/06/18 0600 05/06/18 0700 05/06/18 0800 05/06/18 0900  BP: 138/80  139/84   Pulse: (!) 129 (!) 131 (!) 132 (!) 134  Resp: (!) 27 (!) 26 (!) 26 (!) 36  Temp:   98.7 F (37.1 C)   TempSrc:   Oral   SpO2: 97% 96% 96% 99%  Weight:      Height:        Intake/Output Summary (Last 24  hours) at 05/06/2018 1023 Last data filed at 05/06/2018 0800 Gross per 24 hour  Intake 1910.04 ml  Output 1250 ml  Net 660.04 ml   Filed Weights   05-25-2018 2151 05/03/18 0449 05/05/18 0325  Weight: 48.5 kg 51.5 kg 48.5 kg    Examination:  Constitutional: Lethargic, responding when called but moans in pain at all times Eyes: No scleral icterus ENMT: Dry mucous membranes Respiratory: Shallow breathing, tachypneic, no wheezing, increased respiratory effort Cardiovascular: Significantly tachycardic, regular without murmurs Abdomen: Soft, NT, ND Musculoskeletal: no clubbing / cyanosis. Skin: No rash seen Neurologic: Lethargic, intermittently follows commands   Data Reviewed: I have independently reviewed following labs and imaging studies    CBC: Recent Labs  Lab 2018-05-25 2212 05/03/18 0407 05/03/18 0644 05/04/18 0300 05/05/18 0240 05/06/18 0616  WBC 4.9  --  3.8* 5.2 6.4 7.3  NEUTROABS 3.3  --   --   --   --   --   HGB 9.7* 8.9* 9.0* 9.6* 9.6* 9.5*  HCT 33.0* 29.6* 30.7* 33.6* 34.0* 33.1*  MCV 92.7  --  93.0 96.8 98.8 97.6  PLT 254  --  218 274 249 295   Basic Metabolic Panel: Recent Labs  Lab 2018/05/25 2212 05/03/18 0644 05/04/18 0300 05/05/18 0240 05/06/18 0616  NA 141 137 137 137 136  K 4.4 4.2 4.7 4.9 5.6*  CL 103 105 107 108 106  CO2 21* 18* 9* 9* 12*  GLUCOSE 108* 94 115* 107* 87  BUN 17 12 10 13  21*  CREATININE 0.51 0.40* 0.65 0.52 0.59  CALCIUM 9.8 8.9 9.3 9.8 9.6   GFR: Estimated Creatinine Clearance: 61.1 mL/min (by C-G formula based on SCr of 0.59 mg/dL). Liver Function Tests: Recent Labs  Lab 05/25/18 2212 05/05/18 0240  AST 82* 37  ALT 16 18  ALKPHOS 86 70  BILITOT 0.4 0.5  PROT 7.1 7.4  ALBUMIN 3.5 4.1   No results for input(s): LIPASE, AMYLASE in the last 168 hours. No results for input(s): AMMONIA in the last 168 hours. Coagulation Profile: Recent Labs  Lab May 25, 2018 2212  INR 1.2   Cardiac Enzymes: Recent Labs  Lab  05/05/18 0932 05/05/18 1617 05/05/18 2059  TROPONINI <0.03 <0.03 <0.03   BNP (last 3 results) No results for input(s): PROBNP in the last 8760 hours. HbA1C: No results for input(s): HGBA1C in the last 72 hours. CBG: No results for input(s): GLUCAP in the last 168 hours. Lipid Profile: No results for input(s): CHOL, HDL, LDLCALC, TRIG, CHOLHDL, LDLDIRECT in the last 72 hours. Thyroid Function Tests: Recent Labs    05/04/18 1255  TSH 0.156*   Anemia Panel: No results for input(s): VITAMINB12, FOLATE, FERRITIN, TIBC, IRON, RETICCTPCT in the last 72 hours. Urine analysis:    Component Value Date/Time   COLORURINE STRAW (A) 05/03/2018 0411   APPEARANCEUR CLEAR 05/03/2018 0411   LABSPEC 1.018 05/03/2018 0411   PHURINE 5.0 05/03/2018 0411   GLUCOSEU NEGATIVE 05/03/2018 0411   HGBUR NEGATIVE 05/03/2018 0411   BILIRUBINUR NEGATIVE 05/03/2018 0411   KETONESUR 5 (A) 05/03/2018 0411   PROTEINUR NEGATIVE 05/03/2018 0411   UROBILINOGEN 1.0  02/11/2011 1051   NITRITE NEGATIVE 05/03/2018 0411   LEUKOCYTESUR NEGATIVE 05/03/2018 0411   Sepsis Labs: Invalid input(s): PROCALCITONIN, LACTICIDVEN  Recent Results (from the past 240 hour(s))  Culture, blood (Routine x 2)     Status: None (Preliminary result)   Collection Time: 05/12/2018 10:18 PM  Result Value Ref Range Status   Specimen Description   Final    BLOOD BLOOD LEFT FOREARM Performed at [facility] [address] [place], [place] [address]    Special Requests   Final    BOTTLES DRAWN AEROBIC AND ANAEROBIC Blood Culture adequate volume Performed at [facility] [address] [place], [place] [address]    Culture   Final    NO GROWTH 2 DAYS Performed at [facility] [address] [place], [place] [address]    Report Status PENDING  Incomplete  Culture, blood (Routine x 2)     Status: None (Preliminary result)   Collection Time: 05/16/2018 11:31 PM  Result Value Ref Range  Status   Specimen Description   Final    BLOOD RIGHT ANTECUBITAL Performed at [facility] [address] [place], [place] [address]    Special Requests   Final    BOTTLES DRAWN AEROBIC AND ANAEROBIC Blood Culture adequate volume Performed at [facility] [address] [place], [place] [address]    Culture   Final    NO GROWTH 2 DAYS Performed at [facility] [address], [place], [place] [address]    Report Status PENDING  Incomplete  Urine culture     Status: None   Collection Time: 05/03/18  4:11 AM  Result Value Ref Range Status   Specimen Description   Final    URINE, CLEAN CATCH Performed at [facility], [place] [address], [place], [place] [address]    Special Requests   Final    NONE Performed at [facility], [place] [address], [place], [place] [address]    Culture   Final    NO GROWTH Performed at [facility], [place] [address], [place], [place] [address]    Report Status 05/04/2018 FINAL  Final  MRSA PCR Screening     Status: None   Collection Time: 05/03/18  5:05 AM  Result Value Ref Range Status   MRSA by PCR NEGATIVE NEGATIVE Final    Comment:        The GeneXpert MRSA Assay (FDA approved for NASAL specimens only), is one component of a comprehensive MRSA colonization surveillance program. It is not intended to diagnose MRSA infection nor to guide or monitor treatment for MRSA infections. Performed at [facility], [place] [address] [place],  [address]       Radiology Studies: Ir Ivc Filter Plmt / S&i /img Guid/mod Sed  Result Date: 05/05/2018 INDICATION: 48 year old with metastatic thyroid cancer, age-indeterminate calf DVT and an intermediate V/Q scan. Patient does not want anticoagulation due to metastatic disease and history of hemoptysis. Plan for IVC filter placement. Patient also has a severe contrast allergy and plan to use carbon  dioxide for imaging. EXAM: IVC FILTER PLACEMENT; IVC VENOGRAM; ULTRASOUND FOR VASCULAR ACCESS Physician: Stephan Minister. Anselm Pancoast, MD MEDICATIONS: None. ANESTHESIA/SEDATION: None CONTRAST:  Comment access FLUOROSCOPY TIME:  Fluoroscopy Time: 9 minutes, 54 seconds, 329 mGy COMPLICATIONS: None immediate. PROCEDURE: Informed consent was obtained for an IVC venogram and filter placement. Ultrasound demonstrated a patent right internal jugular  vein. Ultrasound images were obtained for documentation. The right side of the neck was prepped and draped in a sterile fashion. Maximal barrier sterile technique was utilized including caps, mask, sterile gowns, sterile gloves, sterile drape, hand hygiene and skin antiseptic. The skin was anesthetized with 1% lidocaine. A 21 gauge needle was directed into the vein with ultrasound guidance and a micropuncture dilator set was placed. A wire was advanced into the IVC. The filter sheath was advanced over the wire into the IVC. An IVC venogram was performed with carbon dioxide. Fluoroscopic images were obtained for documentation. Both renal veins were cannulated using a Berenstein catheter and Bentson wire. Bilateral renal veins were identified. A Bard Denali filter was deployed below the lowest renal vein. Bilateral renal veins were cannulated after filter placement to confirm placement below the renal veins. Vascular sheath was removed with manual compression. FINDINGS: IVC was patent. Bilateral renal veins were identified. The filter was deployed below the lowest renal vein. IMPRESSION: Successful placement of a retrievable IVC filter. PLAN: Due to patient related comorbidities and/or clinical necessity, this IVC filter should be considered a permanent device. This patient will not be actively followed for future filter retrieval. Electronically Signed   By: Markus Daft M.D.   On: 05/05/2018 18:10    Marzetta Board, MD, PhD [facility]  Contact via  [email or web address]  [place] P: [phone]  F: [phone]

## 2018-05-06 NOTE — Progress Notes (Signed)
Daily Progress Note   Patient Name: Kendra Mendez       Date: 05/06/2018 DOB: 10/07/1970  Age: 48 y.o. MRN#: [id number] Attending Physician: Caren Griffins, MD Primary Care Physician: System, Provider Not In Admit Date: 05/09/2018  Reason for Consultation/Follow-up: Pain control  Subjective:  patient is much less responsive, she is resting in bed, with eyes open, she is able to open her eyes further and also give a small hand squeeze when her sister arrived in the room.   She has ongoing tachypnea and is not alert, awake anymore.   Patient appears to be actively dying.   Mother and sister are now at the bedside, son and daughter are being contacted by the patient's sister.    PPS 10%  See below.   Length of Stay: 3  Current Medications: Scheduled Meds:  . Chlorhexidine Gluconate Cloth  6 each Topical Daily  . heparin injection (subcutaneous)  5,000 Units Subcutaneous Q8H  . levalbuterol  1.25 mg Nebulization TID  . lidocaine  1 patch Transdermal Q24H  . mouth rinse  15 mL Mouth Rinse BID  . methylPREDNISolone (SOLU-MEDROL) injection  60 mg Intravenous Q12H    Continuous Infusions: . sodium chloride 125 mL/hr at 05/06/18 0313  . morphine      PRN Meds: acetaminophen **OR** acetaminophen, LORazepam, morphine **AND** morphine, ondansetron **OR** ondansetron (ZOFRAN) IV  Physical Exam         Patient in   severe distress this morning Chest pain Appears tachypneic as well Abdomen is soft, not distended No edema Eyes fixed with upward gaze, she is essentially unresponsive.   Vital Signs: BP 139/84 (BP Location: Right Arm)   Pulse (!) 134   Temp 98.7 F (37.1 C) (Oral)   Resp (!) 36   Ht 5' (1.524 m)   Wt 48.5 kg   SpO2 99%   BMI 20.88 kg/m  SpO2: SpO2: 99 % O2  Device: O2 Device: Nasal Cannula O2 Flow Rate: O2 Flow Rate (L/min): 3 L/min  Intake/output summary:   Intake/Output Summary (Last 24 hours) at 05/06/2018 0953 Last data filed at 05/06/2018 0800 Gross per 24 hour  Intake 1910.04 ml  Output 1250 ml  Net 660.04 ml   LBM: Last BM Date: 05/03/18 Baseline Weight: Weight: 48.5 kg Most recent weight: Weight: 48.5 kg  PPS 10%      Palliative Assessment/Data:    Flowsheet Rows     Most Recent Value  Intake Tab  Referral Department  Hospitalist  Unit at Time of Referral  ER  Palliative Care Primary Diagnosis  Cancer  Date Notified  05/03/18  Palliative Care Type  New Palliative care  Reason for referral  Clarify Goals of Care  Date of Admission  05/27/2018  Date first seen by Palliative Care  05/04/18  # of days Palliative referral response time  1 Day(s)  # of days IP prior to Palliative referral  1  Clinical Assessment  Psychosocial & Spiritual Assessment  Palliative Care Outcomes      Patient Active Problem List   Diagnosis Date Noted  . Palliative care by specialist   . Goals of care, counseling/discussion   . Acute respiratory failure with hypoxia ([place]) 05/03/2018  . Chest pain 05/03/2018  . Lactic acidosis 05/03/2018  . Palliative care encounter 03/24/2018  . Pathologic thoracic fracture 02/15/2018  . Primary cancer of thyroid gland metastatic to bone ([place]) 02/15/2018  . Normocytic normochromic anemia 02/15/2018  . Hypothyroidism 02/15/2018  . Respiratory distress 02/14/2018  . Cough with hemoptysis 04/23/2012  . Thyroid cancer ([place]) 04/23/2012  . Lung metastases ([place]) 04/23/2012    Palliative Care Assessment & Plan   Patient Profile:  48 yr old F w/ PMHx Stage IV follicular thyroid CA Hurthle cell presents to [facility] with chest pain &SOB. Known metastatic disease to spine, bone, brain &lungs with prior hemoptysis s/p bronchial artery embolization.  Patient currently admitted to stepdown unit at [facility] with chest pain and shortness of breath.   Assessment:  chest pain Tachypnea PPS 30% Anxiety Subjective sensation of dyspnea.  Actively dying.   Recommendations/Plan:    IV Morphine drip with bolus doses also available.   continue supplemental O2, lidoderm patch and PRN IV Ativan for supportive care.   Decrease rate of IVF and D/C PO medications not contributory to comfort.    Appreciate chaplain consult and follow up.   actively dying, prognosis likely not more than few hours to some very limited number of days at this point.   DNR DNI and full scope of comfort measures discussed with patient's primary caregiver, her sister, as well as her mother. Patient's 2 children, a son and a daughter have also been contacted to arrive to the hospital as soon as possible for them.   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 She was diagnosed with cancer 16-17 years ago, her mother states that there are a lot of people praying for the patient. Patient's mother is distraught, she states that cancer is like a worm that is always hungry for more, she states that the patient is "eat up with cancer."  We talked about using Morphine infusion for comfort measures.   Anticipate hospital death.   Chaplain follow up.  Provide comfort cart for family.    Code Status:    Code Status Orders  (From admission, onward)         Start     Ordered   05/03/18 0431  Limited resuscitation (code)  Continuous    Question Answer Comment  In the event of cardiac or respiratory ARREST: Initiate Code Blue, Call Rapid Response Yes   In the event of cardiac or respiratory ARREST: Perform CPR Yes   In the event of cardiac or respiratory ARREST: Perform Intubation/Mechanical Ventilation No  In the event of cardiac or respiratory ARREST: Use NIPPV/BiPAp only if indicated Yes   In the event of cardiac or respiratory ARREST: Administer ACLS medications if indicated Yes    In the event of cardiac or respiratory ARREST: Perform Defibrillation or Cardioversion if indicated Yes      05/03/18 0430        Code Status History    Date Active Date Inactive Code Status Order ID Comments User Context   05/03/2018 0245 05/03/2018 0430 Full Code [id number]  Ivor Costa, MD ED   02/14/2018 2324 02/18/2018 2023 Full Code [id number]  Norval Morton, MD ED   04/23/2012 0647 04/25/2012 0111 Full Code [id number]  Orvan Falconer, MD Inpatient       Prognosis:   minutes to hours.   Discharge Planning:  Anticipated hospital death.   Care plan was discussed with  Bedside RN as well as with TR[facility] MD. Also discussed with patient's mother and sister in the room. Sister attempted to call patient's son when I was in the room, went to voicemail, sister states she will contact both son and daughter to arrive at the hospital as soon as possible.   Thank you for allowing the Palliative Medicine Team to assist in the care of this patient.   Time In:  9.25 Time Out: 10.00 Total Time 35 Prolonged Time Billed  no       Greater than 50%  of this time was spent counseling and coordinating care related to the above assessment and plan.  Loistine Chance, MD [phone] Please contact Palliative Medicine Team phone at [phone] for questions and concerns.

## 2018-05-06 NOTE — Progress Notes (Signed)
  Echocardiogram 2D Echocardiogram has been performed.  Kendra Mendez M 05/06/2018, 8:36 AM

## 2018-05-07 DIAGNOSIS — Z515 Encounter for palliative care: Secondary | ICD-10-CM

## 2018-05-07 NOTE — Progress Notes (Signed)
Daily Progress Note   Patient Name: Kendra Mendez       Date: 05/07/2018 DOB: 1970/06/28  Age: 48 y.o. MRN#: [id number] Attending Physician: Caren Griffins, MD Primary Care Physician: System, Provider Not In Admit Date: 05/21/2018  Reason for Consultation/Follow-up: Pain control  Subjective:  patient is actively dying She is unresponsive Eyes some what open, fixed gaze Doesn't respond Mother at bedside, holding vigil and reading to the patient scripture out loud.       PPS 10%  See below.   Length of Stay: 4  Current Medications: Scheduled Meds:  . Chlorhexidine Gluconate Cloth  6 each Topical Daily  . lidocaine  1 patch Transdermal Q24H  . mouth rinse  15 mL Mouth Rinse BID  . methylPREDNISolone (SOLU-MEDROL) injection  60 mg Intravenous Q12H    Continuous Infusions: . sodium chloride 10 mL/hr at 05/06/18 1013  . morphine 2 mg/hr (05/07/18 0900)    PRN Meds: acetaminophen **OR** acetaminophen, LORazepam, morphine **AND** morphine, ondansetron **OR** ondansetron (ZOFRAN) IV  Physical Exam         Patient actively dying Unresponsive Rapid shallow breathing Tachycardic on exam  No edema Eyes fixed with upward gaze, she is essentially unresponsive.   Vital Signs: BP 134/82   Pulse (!) 120   Temp 98.2 F (36.8 C) (Axillary)   Resp 14   Ht 5' (1.524 m)   Wt 48.5 kg   SpO2 97%   BMI 20.88 kg/m  SpO2: SpO2: 97 % O2 Device: O2 Device: Nasal Cannula O2 Flow Rate: O2 Flow Rate (L/min): 3 L/min  Intake/output summary:   Intake/Output Summary (Last 24 hours) at 05/07/2018 1100 Last data filed at 05/07/2018 0900 Gross per 24 hour  Intake 160 ml  Output 1300 ml  Net -1140 ml   LBM: Last BM Date: 05/03/18 Baseline Weight: Weight: 48.5 kg Most recent weight:  Weight: 48.5 kg PPS 10%      Palliative Assessment/Data:    Flowsheet Rows     Most Recent Value  Intake Tab  Referral Department  Hospitalist  Unit at Time of Referral  ER  Palliative Care Primary Diagnosis  Cancer  Date Notified  05/03/18  Palliative Care Type  New Palliative care  Reason for referral  Clarify Goals of Care  Date of Admission  05/05/2018  Date first seen by Palliative Care  05/04/18  # of days Palliative referral response time  1 Day(s)  # of days IP prior to Palliative referral  1  Clinical Assessment  Psychosocial & Spiritual Assessment  Palliative Care Outcomes      Patient Active Problem List   Diagnosis Date Noted  . Dying care   . Palliative care by specialist   . Goals of care, counseling/discussion   . Acute respiratory failure with hypoxia ([place]) 05/03/2018  . Chest pain 05/03/2018  . Lactic acidosis 05/03/2018  . Palliative care encounter 03/24/2018  . Pathologic thoracic fracture 02/15/2018  . Primary cancer of thyroid gland metastatic to bone ([place]) 02/15/2018  . Normocytic normochromic anemia 02/15/2018  . Hypothyroidism 02/15/2018  . Respiratory distress 02/14/2018  . Cough with hemoptysis 04/23/2012  . Thyroid cancer ([place]) 04/23/2012  . Lung metastases ([place]) 04/23/2012    Palliative Care Assessment & Plan   Patient Profile:  48 yr old F w/ PMHx Stage IV follicular thyroid CA Hurthle cell presents to [facility] with chest pain &SOB. Known metastatic disease to spine, bone, brain &lungs with prior hemoptysis s/p bronchial artery embolization.  Patient currently admitted to stepdown unit at [facility] with chest pain and shortness of breath.   Assessment:  chest pain Tachypnea PPS 30% Anxiety Subjective sensation of dyspnea.  Actively dying.   Recommendations/Plan:    IV Morphine drip with bolus doses also available. Continue to up titrate based on comfort needs, currently comfortable at 2 mg/hour.   continue  supplemental O2, lidoderm patch and PRN IV Ativan for supportive care.   Decrease rate of IVF and D/C PO medications not contributory to comfort.    Appreciate chaplain consult and follow up.   actively dying, prognosis likely not more than few hours to some very limited number of days at this point.      Anticipate hospital death.   Chaplain follow up.    comfort cart has been provided for family.    Code Status:    Code Status Orders  (From admission, onward)         Start     Ordered   05/03/18 0431  Limited resuscitation (code)  Continuous    Question Answer Comment  In the event of cardiac or respiratory ARREST: Initiate Code Blue, Call Rapid Response Yes   In the event of cardiac or respiratory ARREST: Perform CPR Yes   In the event of cardiac or respiratory ARREST: Perform Intubation/Mechanical Ventilation No   In the event of cardiac or respiratory ARREST: Use NIPPV/BiPAp only if indicated Yes   In the event of cardiac or respiratory ARREST: Administer ACLS medications if indicated Yes   In the event of cardiac or respiratory ARREST: Perform Defibrillation or Cardioversion if indicated Yes      05/03/18 0430        Code Status History    Date Active Date Inactive Code Status Order ID Comments User Context   05/03/2018 0245 05/03/2018 0430 Full Code [id number]  Ivor Costa, MD ED   02/14/2018 2324 02/18/2018 2023 Full Code [id number]  Norval Morton, MD ED   04/23/2012 0647 04/25/2012 0111 Full Code [id number]  Orvan Falconer, MD Inpatient       Prognosis:   minutes to hours.   Discharge Planning:  Anticipated hospital death.   Care plan was discussed with  Patient's mother at bedside this am.   Thank you for allowing the Palliative Medicine Team to assist  in the care of this patient.   Time In: 10 Time Out: 10.25 Total Time 25 Prolonged Time Billed  no       Greater than 50%  of this time was spent counseling and coordinating care related to the above assessment  and plan.  Loistine Chance, MD [phone] Please contact Palliative Medicine Team phone at [phone] for questions and concerns.

## 2018-05-07 NOTE — Progress Notes (Signed)
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 In the ED she had a positive d-dimer and lactic acidosis, she was placed on antibiotics empirically and admitted to the stepdown.  Critical care was consulted.  Subjective: -Appears comfortable.  Comfort care measures are in place.  Assessment & Plan: Principal Problem:   Acute respiratory failure with hypoxia (HCC) Active Problems:   Thyroid cancer (HCC)   Normocytic normochromic anemia   Hypothyroidism   Chest pain   Lactic acidosis   Palliative care by specialist   Goals of care, counseling/discussion   Dying care   Principal Problem Stage IV metastatic thyroid cancer  -With significant bone and lung metastasis as well as probable epidural metastasis at T10 -Here with chest pain, probably related to her underlying metastatic disease.  Chest pain is reproducible with palpation.  EKG and troponins did not show ischemia, serial cardiac enzymes and EKG did not show any active ischemia -Followed at [facility] by Dr. Dennison Nancy, I have discussed with her over the phone -Her disease seems to be progressing significantly, she has impressive tumor burden with lactic acidosis, excruciating pain, tachycardia and tachypnea -She is declining despite treatment and it appears that she is entering the dying process.  Now on comfort care  Active Problems Age-indeterminate DVT -Would benefit from anticoagulation however patient has a history of massive hemoptysis requiring IR embolization past.  Has not had any bleeding in the past 2  years.  VQ scan was of indeterminate.  Unable to do CT angiogram due to severe dye allergy despite premedication. Possibly also has underlying PE, risks and benefits of anticoagulation versus IVC filter were discussed with patient, she declined anticoagulation and is status post IVC filter per IR  Severe lactic acidosis causing metabolic acidosis with partial respiratory compensation -Likely related to aggressive tumor burden/malignancy Anemia of chronic disease Hypothyroidism   Scheduled Meds: . Chlorhexidine Gluconate Cloth  6 each Topical Daily  . lidocaine  1 patch Transdermal Q24H  . mouth rinse  15 mL Mouth Rinse BID  . methylPREDNISolone (SOLU-MEDROL) injection  60 mg Intravenous Q12H   Continuous Infusions: . sodium chloride 10 mL/hr at 05/06/18 1013  . morphine 2 mg/hr (05/07/18 0900)   PRN Meds:.acetaminophen **OR** acetaminophen, LORazepam, morphine **AND** morphine, ondansetron **OR** ondansetron (ZOFRAN) IV  DVT prophylaxis: comfort Code Status: DNR Family Communication: Mother at bedside Disposition Plan: Anticipate in hospital death  Consultants:   Critical care  Palliative care  Procedures:   None   Antimicrobials:  None    Objective: Vitals:   05/07/18 0100 05/07/18 0400 05/07/18 0500 05/07/18 0600  BP:      Pulse: (!) 119 (!) 119 (!) 120 (!) 120  Resp: 19 18 17 14   Temp:      TempSrc:      SpO2: 97% 97% 97% 97%  Weight:      Height:        Intake/Output Summary (Last 24 hours) at 05/07/2018 0938 Last data filed at 05/07/2018 0900 Gross per 24 hour  Intake 296 ml  Output 1300 ml  Net -1004 ml   Filed  Weights   05/18/2018 2151 05/03/18 0449 05/05/18 0325  Weight: 48.5 kg 51.5 kg 48.5 kg    Examination:  Constitutional: Does not appear to be in distress, shallow breathing, unresponsive Respiratory: Tachypneic Cardiovascular: Significantly tachycardic   Data Reviewed: I have independently reviewed following labs and imaging studies     CBC: Recent Labs  Lab 05/07/2018 2212 05/03/18 0407 05/03/18 0644 05/04/18 0300 05/05/18 0240 05/06/18 0616  WBC 4.9  --  3.8* 5.2 6.4 7.3  NEUTROABS 3.3  --   --   --   --   --   HGB 9.7* 8.9* 9.0* 9.6* 9.6* 9.5*  HCT 33.0* 29.6* 30.7* 33.6* 34.0* 33.1*  MCV 92.7  --  93.0 96.8 98.8 97.6  PLT 254  --  218 274 249 786   Basic Metabolic Panel: Recent Labs  Lab 05/10/2018 2212 05/03/18 0644 05/04/18 0300 05/05/18 0240 05/06/18 0616  NA 141 137 137 137 136  K 4.4 4.2 4.7 4.9 5.6*  CL 103 105 107 108 106  CO2 21* 18* 9* 9* 12*  GLUCOSE 108* 94 115* 107* 87  BUN 17 12 10 13  21*  CREATININE 0.51 0.40* 0.65 0.52 0.59  CALCIUM 9.8 8.9 9.3 9.8 9.6   GFR: Estimated Creatinine Clearance: 61.1 mL/min (by C-G formula based on SCr of 0.59 mg/dL). Liver Function Tests: Recent Labs  Lab 05/31/2018 2212 05/05/18 0240  AST 82* 37  ALT 16 18  ALKPHOS 86 70  BILITOT 0.4 0.5  PROT 7.1 7.4  ALBUMIN 3.5 4.1   No results for input(s): LIPASE, AMYLASE in the last 168 hours. No results for input(s): AMMONIA in the last 168 hours. Coagulation Profile: Recent Labs  Lab 05/05/2018 2212  INR 1.2   Cardiac Enzymes: Recent Labs  Lab 05/05/18 0932 05/05/18 1617 05/05/18 2059  TROPONINI <0.03 <0.03 <0.03   BNP (last 3 results) No results for input(s): PROBNP in the last 8760 hours. HbA1C: No results for input(s): HGBA1C in the last 72 hours. CBG: No results for input(s): GLUCAP in the last 168 hours. Lipid Profile: No results for input(s): CHOL, HDL, LDLCALC, TRIG, CHOLHDL, LDLDIRECT in the last 72 hours. Thyroid Function Tests: Recent Labs    05/04/18 1255  TSH 0.156*   Anemia Panel: No results for input(s): VITAMINB12, FOLATE, FERRITIN, TIBC, IRON, RETICCTPCT in the last 72 hours. Urine analysis:    Component Value Date/Time   COLORURINE STRAW (A) 05/03/2018 0411   APPEARANCEUR CLEAR 05/03/2018 0411   LABSPEC 1.018 05/03/2018 0411   PHURINE 5.0 05/03/2018 0411    GLUCOSEU NEGATIVE 05/03/2018 0411   HGBUR NEGATIVE 05/03/2018 0411   BILIRUBINUR NEGATIVE 05/03/2018 0411   KETONESUR 5 (A) 05/03/2018 0411   PROTEINUR NEGATIVE 05/03/2018 0411   UROBILINOGEN 1.0 02/11/2011 1051   NITRITE NEGATIVE 05/03/2018 0411   LEUKOCYTESUR NEGATIVE 05/03/2018 0411   Sepsis Labs: Invalid input(s): PROCALCITONIN, LACTICIDVEN  Recent Results (from the past 240 hour(s))  Culture, blood (Routine x 2)     Status: None (Preliminary result)   Collection Time: 05/01/2018 10:18 PM  Result Value Ref Range Status   Specimen Description   Final    BLOOD BLOOD LEFT FOREARM Performed at [facility] [address] [place], [place] [address]    Special Requests   Final    BOTTLES DRAWN AEROBIC AND ANAEROBIC Blood Culture adequate volume Performed at [facility] [address], [place], [place] [address]    Culture   Final    NO  GROWTH 4 DAYS Performed at [facility], [place] [address], [place], [place] [address]    Report Status PENDING  Incomplete  Culture, blood (Routine x 2)     Status: None (Preliminary result)   Collection Time: 05/23/2018 11:31 PM  Result Value Ref Range Status   Specimen Description   Final    BLOOD RIGHT ANTECUBITAL Performed at [facility] [address], [place], [place] [address]    Special Requests   Final    BOTTLES DRAWN AEROBIC AND ANAEROBIC Blood Culture adequate volume Performed at [facility] [address] [place], [place] [address]    Culture   Final    NO GROWTH 4 DAYS Performed at [facility], [place] [address] [place], [place] [address]    Report Status PENDING  Incomplete  Urine culture     Status: None   Collection Time: 05/03/18  4:11 AM  Result Value Ref Range Status   Specimen Description   Final    URINE, CLEAN CATCH Performed at [facility], [place] [address] [place], [place] [address]    Special Requests    Final    NONE Performed at [facility], [place] [address] [place], [place] [address]    Culture   Final    NO GROWTH Performed at [facility], [place] [address] [place], [place] [address]    Report Status 05/04/2018 FINAL  Final  MRSA PCR Screening     Status: None   Collection Time: 05/03/18  5:05 AM  Result Value Ref Range Status   MRSA by PCR NEGATIVE NEGATIVE Final    Comment:        The GeneXpert MRSA Assay (FDA approved for NASAL specimens only), is one component of a comprehensive MRSA colonization surveillance program. It is not intended to diagnose MRSA infection nor to guide or monitor treatment for MRSA infections. Performed at [facility], [place] [address], [place], [place] [address]       Radiology Studies: Ir Ivc Filter Plmt / S&i /img Guid/mod Sed  Result Date: 05/05/2018 INDICATION: 48 year old with metastatic thyroid cancer, age-indeterminate calf DVT and an intermediate V/Q scan. Patient does not want anticoagulation due to metastatic disease and history of hemoptysis. Plan for IVC filter placement. Patient also has a severe contrast allergy and plan to use carbon dioxide for imaging. EXAM: IVC FILTER PLACEMENT; IVC VENOGRAM; ULTRASOUND FOR VASCULAR ACCESS Physician: Stephan Minister. Anselm Pancoast, MD MEDICATIONS: None. ANESTHESIA/SEDATION: None CONTRAST:  Comment access FLUOROSCOPY TIME:  Fluoroscopy Time: 9 minutes, 54 seconds, 295 mGy COMPLICATIONS: None immediate. PROCEDURE: Informed consent was obtained for an IVC venogram and filter placement. Ultrasound demonstrated a patent right internal jugular vein. Ultrasound images were obtained for documentation. The right side of the neck was prepped and draped in a sterile fashion. Maximal barrier sterile technique was utilized including caps, mask, sterile gowns, sterile gloves, sterile drape, hand hygiene and skin antiseptic. The skin was anesthetized with 1% lidocaine. A 21 gauge needle was  directed into the vein with ultrasound guidance and a micropuncture dilator set was placed. A wire was advanced into the IVC. The filter sheath was advanced over the wire into the IVC. An IVC venogram was performed with carbon dioxide. Fluoroscopic images were obtained for documentation. Both renal veins were cannulated using a Berenstein catheter and Bentson wire. Bilateral renal veins were identified. A Bard Denali filter was deployed below the lowest renal vein. Bilateral  renal veins were cannulated after filter placement to confirm placement below the renal veins. Vascular sheath was removed with manual compression. FINDINGS: IVC was patent. Bilateral renal veins were identified. The filter was deployed below the lowest renal vein. IMPRESSION: Successful placement of a retrievable IVC filter. PLAN: Due to patient related comorbidities and/or clinical necessity, this IVC filter should be considered a permanent device. This patient will not be actively followed for future filter retrieval. Electronically Signed   By: Markus Daft M.D.   On: 05/05/2018 18:10    Marzetta Board, MD, PhD [facility]  Contact via  [email or web address]  [place] P: [phone]  F: [phone]

## 2018-05-07 NOTE — Progress Notes (Signed)
Patient to transfer to [facility] report given to receiving nurse, all questions answered at this time.  Belongings sent with patient.

## 2018-05-07 NOTE — Progress Notes (Signed)
Patient transferred to 1503. Moved patient from ICU bed to unit bed. No signs of discomfort at this time. Family wants to refrain from any unnecessary turning of patient. Morphine drip running at 2mg /hr. Will continue to monitor.

## 2018-05-08 LAB — CULTURE, BLOOD (ROUTINE X 2)
CULTURE: NO GROWTH
Culture: NO GROWTH
Special Requests: ADEQUATE
Special Requests: ADEQUATE

## 2018-05-08 MED ORDER — GLYCOPYRROLATE 0.2 MG/ML IJ SOLN
0.1000 mg | Freq: Once | INTRAMUSCULAR | Status: AC
  Administered 2018-05-08: 0.1 mg via INTRAVENOUS
  Filled 2018-05-08: qty 0.5

## 2018-05-08 MED ORDER — NAPHAZOLINE-GLYCERIN 0.012-0.2 % OP SOLN
1.0000 [drp] | Freq: Four times a day (QID) | OPHTHALMIC | Status: DC | PRN
  Administered 2018-05-08: 2 [drp] via OPHTHALMIC
  Administered 2018-05-10: 1 [drp] via OPHTHALMIC
  Administered 2018-05-11: 2 [drp] via OPHTHALMIC
  Filled 2018-05-08: qty 15

## 2018-05-08 NOTE — Progress Notes (Signed)
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 In the ED she had a positive d-dimer and lactic acidosis, she was placed on antibiotics empirically and admitted to the stepdown.  Critical care was consulted.  Subjective: -unresponsive  Assessment & Plan: Principal Problem:   Acute respiratory failure with hypoxia (HCC) Active Problems:   Thyroid cancer (HCC)   Normocytic normochromic anemia   Hypothyroidism   Chest pain   Lactic acidosis   Palliative care by specialist   Goals of care, counseling/discussion   Dying care   Principal Problem Stage IV metastatic thyroid cancer  -With significant bone and lung metastasis as well as probable epidural metastasis at T10 -Here with chest pain, probably related to her underlying metastatic disease.  Chest pain is reproducible with palpation.  EKG and troponins did not show ischemia, serial cardiac enzymes and EKG did not show any active ischemia -Followed at [facility] by Dr. Dennison Nancy, I have discussed with her over the phone -Her disease seems to be progressing significantly, she has impressive tumor burden with lactic acidosis, excruciating pain, tachycardia and tachypnea -She is declining despite treatment and it appears that she is entering the dying process.  Now on comfort care  Active Problems Age-indeterminate DVT -Would benefit from anticoagulation however patient has a history of massive hemoptysis requiring IR embolization past.  Has not had any bleeding in the past 2 years.  VQ scan was of indeterminate.  Unable  to do CT angiogram due to severe dye allergy despite premedication. Possibly also has underlying PE, risks and benefits of anticoagulation versus IVC filter were discussed with patient, she declined anticoagulation and is status post IVC filter per IR  Severe lactic acidosis causing metabolic acidosis with partial respiratory compensation -Likely related to aggressive tumor burden/malignancy Anemia of chronic disease Hypothyroidism   Scheduled Meds: . Chlorhexidine Gluconate Cloth  6 each Topical Daily  . lidocaine  1 patch Transdermal Q24H  . mouth rinse  15 mL Mouth Rinse BID  . methylPREDNISolone (SOLU-MEDROL) injection  60 mg Intravenous Q12H   Continuous Infusions: . sodium chloride 10 mL/hr at 05/06/18 1013  . morphine 2 mg/hr (05/08/18 0046)   PRN Meds:.acetaminophen **OR** acetaminophen, LORazepam, morphine **AND** morphine, naphazoline-glycerin, ondansetron **OR** ondansetron (ZOFRAN) IV  DVT prophylaxis: comfort Code Status: DNR Family Communication: Mother at bedside Disposition Plan: Anticipate in hospital death  Consultants:   Critical care  Palliative care  Procedures:   None   Antimicrobials:  None    Objective: Vitals:   05/07/18 0400 05/07/18 0500 05/07/18 0600 05/08/18 0505  BP:    127/90  Pulse: (!) 119 (!) 120 (!) 120 (!) 121  Resp: 18 17 14  (!) 24  Temp:    99.1 F (37.3 C)  TempSrc:    Oral  SpO2: 97% 97% 97% 98%  Weight:      Height:        Intake/Output Summary (Last 24 hours) at 05/08/2018 0851 Last data filed at 05/08/2018 0506 Gross per 24 hour  Intake 52 ml  Output 500 ml  Net -448 ml   [employer]  05-15-18 2151 05/03/18 0449 05/05/18 0325  Weight: 48.5 kg 51.5 kg 48.5 kg    Examination:  Constitutional: Unresponsive, Respiratory: Shallow respirations Cardiovascular: Remains tachycardic   Data Reviewed: I have independently reviewed following labs and imaging studies    CBC: Recent Labs  Lab 2018-05-15 2212  05/03/18 0407 05/03/18 0644 05/04/18 0300 05/05/18 0240 05/06/18 0616  WBC 4.9  --  3.8* 5.2 6.4 7.3  NEUTROABS 3.3  --   --   --   --   --   HGB 9.7* 8.9* 9.0* 9.6* 9.6* 9.5*  HCT 33.0* 29.6* 30.7* 33.6* 34.0* 33.1*  MCV 92.7  --  93.0 96.8 98.8 97.6  PLT 254  --  218 274 249 825   Basic Metabolic Panel: Recent Labs  Lab 15-May-2018 2212 05/03/18 0644 05/04/18 0300 05/05/18 0240 05/06/18 0616  NA 141 137 137 137 136  K 4.4 4.2 4.7 4.9 5.6*  CL 103 105 107 108 106  CO2 21* 18* 9* 9* 12*  GLUCOSE 108* 94 115* 107* 87  BUN 17 12 10 13  21*  CREATININE 0.51 0.40* 0.65 0.52 0.59  CALCIUM 9.8 8.9 9.3 9.8 9.6   GFR: Estimated Creatinine Clearance: 61.1 mL/min (by C-G formula based on SCr of 0.59 mg/dL). Liver Function Tests: Recent Labs  Lab 2018-05-15 2212 05/05/18 0240  AST 82* 37  ALT 16 18  ALKPHOS 86 70  BILITOT 0.4 0.5  PROT 7.1 7.4  ALBUMIN 3.5 4.1   No results for input(s): LIPASE, AMYLASE in the last 168 hours. No results for input(s): AMMONIA in the last 168 hours. Coagulation Profile: Recent Labs  Lab 2018-05-15 2212  INR 1.2   Cardiac Enzymes: Recent Labs  Lab 05/05/18 0932 05/05/18 1617 05/05/18 2059  TROPONINI <0.03 <0.03 <0.03   BNP (last 3 results) No results for input(s): PROBNP in the last 8760 hours. HbA1C: No results for input(s): HGBA1C in the last 72 hours. CBG: No results for input(s): GLUCAP in the last 168 hours. Lipid Profile: No results for input(s): CHOL, HDL, LDLCALC, TRIG, CHOLHDL, LDLDIRECT in the last 72 hours. Thyroid Function Tests: No results for input(s): TSH, T4TOTAL, FREET4, T3FREE, THYROIDAB in the last 72 hours. Anemia Panel: No results for input(s): VITAMINB12, FOLATE, FERRITIN, TIBC, IRON, RETICCTPCT in the last 72 hours. Urine analysis:    Component Value Date/Time   COLORURINE STRAW (A) 05/03/2018 0411   APPEARANCEUR CLEAR 05/03/2018 0411   LABSPEC 1.018 05/03/2018 0411   PHURINE 5.0 05/03/2018 0411    GLUCOSEU NEGATIVE 05/03/2018 0411   HGBUR NEGATIVE 05/03/2018 0411   BILIRUBINUR NEGATIVE 05/03/2018 0411   KETONESUR 5 (A) 05/03/2018 0411   PROTEINUR NEGATIVE 05/03/2018 0411   UROBILINOGEN 1.0 02/11/2011 1051   NITRITE NEGATIVE 05/03/2018 0411   LEUKOCYTESUR NEGATIVE 05/03/2018 0411   Sepsis Labs: Invalid input(s): PROCALCITONIN, LACTICIDVEN  Recent Results (from the past 240 hour(s))  Culture, blood (Routine x 2)     Status: None   Collection Time: May 15, 2018 10:18 PM  Result Value Ref Range Status   Specimen Description   Final    BLOOD BLOOD LEFT FOREARM Performed at [facility] [address] [place], [place] [address]    Special Requests   Final    BOTTLES DRAWN AEROBIC AND ANAEROBIC Blood Culture adequate volume Performed at [facility] [address] [place],  [address]    Culture   Final    NO GROWTH 5 DAYS Performed at [facility], [place]  [address], [place], [place] [address]    Report Status 05/08/2018 FINAL  Final  Culture, blood (Routine x 2)     Status: None   Collection Time: 05/14/2018 11:31 PM  Result Value Ref Range Status   Specimen Description   Final    BLOOD RIGHT ANTECUBITAL Performed at [facility] [address], [place], [place] [address]    Special Requests   Final    BOTTLES DRAWN AEROBIC AND ANAEROBIC Blood Culture adequate volume Performed at [facility] [address], [place], [place] [address]    Culture   Final    NO GROWTH 5 DAYS Performed at [facility], [place] [address], [place], [place] [address]    Report Status 05/08/2018 FINAL  Final  Urine culture     Status: None   Collection Time: 05/03/18  4:11 AM  Result Value Ref Range Status   Specimen Description   Final    URINE, CLEAN CATCH Performed at [facility], [place] [address] [place], [place] [address]    Special Requests   Final    NONE Performed at  [facility], [place] [address] [place], [place] [address]    Culture   Final    NO GROWTH Performed at [facility], [place] [address], [place], [place] [address]    Report Status 05/04/2018 FINAL  Final  MRSA PCR Screening     Status: None   Collection Time: 05/03/18  5:05 AM  Result Value Ref Range Status   MRSA by PCR NEGATIVE NEGATIVE Final    Comment:        The GeneXpert MRSA Assay (FDA approved for NASAL specimens only), is one component of a comprehensive MRSA colonization surveillance program. It is not intended to diagnose MRSA infection nor to guide or monitor treatment for MRSA infections. Performed at [facility], [place] [address] [place], [place] [address]       Radiology Studies: No results found.  Marzetta Board, MD, PhD [facility]  Contact via  [email or web address]  [place] P: [phone]  F: [phone]

## 2018-05-08 NOTE — Progress Notes (Signed)
Daily Progress Note   Patient Name: Kendra Mendez       Date: 05/08/2018 DOB: 12-Apr-1970  Age: 48 y.o. MRN#: [id number] Attending Physician: Caren Griffins, MD Primary Care Physician: System, Provider Not In Admit Date: 05/21/2018  Reason for Consultation/Follow-up: Pain control  Subjective:  patient is actively dying She is unresponsive Eyes some what open, fixed gaze Doesn't respond Mother at bedside, holding vigil and asking for eye drops for the patient.      PPS 10%  See below.   Length of Stay: 5  Current Medications: Scheduled Meds:  . lidocaine  1 patch Transdermal Q24H  . mouth rinse  15 mL Mouth Rinse BID    Continuous Infusions: . sodium chloride 10 mL/hr at 05/06/18 1013  . morphine 2 mg/hr (05/08/18 0046)    PRN Meds: acetaminophen **OR** acetaminophen, LORazepam, morphine **AND** morphine, naphazoline-glycerin, ondansetron **OR** ondansetron (ZOFRAN) IV  Physical Exam         Patient actively dying Unresponsive   shallow breathing regular  No edema Eyes fixed with upward gaze, she is essentially unresponsive.   Vital Signs: BP 127/90 (BP Location: Left Arm)   Pulse (!) 121   Temp 99.1 F (37.3 C) (Oral)   Resp (!) 24   Ht 5' (1.524 m)   Wt 48.5 kg   SpO2 98%   BMI 20.88 kg/m  SpO2: SpO2: 98 % O2 Device: O2 Device: Nasal Cannula O2 Flow Rate: O2 Flow Rate (L/min): 3 L/min  Intake/output summary:   Intake/Output Summary (Last 24 hours) at 05/08/2018 1109 Last data filed at 05/08/2018 0506 Gross per 24 hour  Intake -  Output 500 ml  Net -500 ml   LBM: Last BM Date: 05/03/18 Baseline Weight: Weight: 48.5 kg Most recent weight: Weight: 48.5 kg PPS 10%      Palliative Assessment/Data:    Flowsheet Rows     Most Recent Value  Intake Tab   Referral Department  Hospitalist  Unit at Time of Referral  ER  Palliative Care Primary Diagnosis  Cancer  Date Notified  05/03/18  Palliative Care Type  New Palliative care  Reason for referral  Clarify Goals of Care  Date of Admission  05/23/2018  Date first seen by Palliative Care  05/04/18  # of days Palliative referral response time  1 Day(s)  # of days IP prior to Palliative referral  1  Clinical Assessment  Psychosocial & Spiritual Assessment  Palliative Care Outcomes      Patient Active Problem List   Diagnosis Date Noted  . Dying care   . Palliative care by specialist   . Goals of care, counseling/discussion   . Acute respiratory failure with hypoxia (Kendra Mendez) 05/03/2018  . Chest pain 05/03/2018  . Lactic acidosis 05/03/2018  . Palliative care encounter 03/24/2018  . Pathologic thoracic fracture 02/15/2018  . 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 Normocytic normochromic anemia 02/15/2018  . Hypothyroidism 02/15/2018  . Respiratory distress 02/14/2018  . Cough with hemoptysis 04/23/2012  . Thyroid cancer (Kendra Mendez) 04/23/2012  . 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 Known metastatic disease to spine, bone, brain &lungs with prior hemoptysis s/p bronchial artery embolization.  Patient currently admitted to stepdown unit at [facility] with chest pain and shortness of breath.   Assessment:  chest pain Tachypnea PPS 30% Anxiety Subjective sensation of dyspnea.  Actively dying.   Recommendations/Plan:    IV Morphine drip with bolus doses also available. Continue to up titrate based on comfort needs, currently comfortable at 2 mg/hour.   continue supplemental O2, lidoderm patch and PRN IV Ativan for supportive care.   Decrease rate of IVF and D/C PO medications not  contributory to comfort.    Appreciate chaplain consult and follow up.   Eye drops for comfort care have been ordered.    actively dying, prognosis likely not more than few hours to some very limited number of days at this point.      Anticipate Mendez death.   Chaplain follow up.    comfort cart has been provided for family.    Code Status:    Code Status Orders  (From admission, onward)         Start     Ordered   05/03/18 0431  Limited resuscitation (code)  Continuous    Question Answer Comment  In the event of cardiac or respiratory ARREST: Initiate Code Blue, Call Rapid Response Yes   In the event of cardiac or respiratory ARREST: Perform CPR Yes   In the event of cardiac or respiratory ARREST: Perform Intubation/Mechanical Ventilation No   In the event of cardiac or respiratory ARREST: Use NIPPV/BiPAp only if indicated Yes   In the event of cardiac or respiratory ARREST: Administer ACLS medications if indicated Yes   In the event of cardiac or respiratory ARREST: Perform Defibrillation or Cardioversion if indicated Yes      05/03/18 0430        Code Status History    Date Active Date Inactive Code Status Order ID Comments User Context   05/03/2018 0245 05/03/2018 0430 Full Code [id number]  Ivor Costa, MD ED   02/14/2018 2324 02/18/2018 2023 Full Code [id number]  Norval Morton, MD ED   04/23/2012 0647 04/25/2012 0111 Full Code [id number]  Orvan Falconer, MD Inpatient       Prognosis:   minutes to hours.   Discharge Planning:  Anticipated Mendez death.   Care plan was discussed with  Patient's mother at bedside this am.   Thank you for allowing the Palliative Medicine Team to assist in the care of this patient.  Time In: 10 Time Out: 10.25 Total Time 25 Prolonged Time Billed  no       Greater than 50%  of this time was spent counseling and coordinating care related to the above assessment and plan.  Loistine Chance, MD [phone] Please contact Palliative  Medicine Team phone at [phone] for questions and concerns.

## 2018-05-09 NOTE — Progress Notes (Signed)
Daily Progress Note   Patient Name: Kendra Mendez       Date: 05/09/2018 DOB: [birth year]  Age: 48 y.o. MRN#: [id number] Attending Physician: Caren Griffins, MD Primary Care Physician: System, Provider Not In Admit Date: 05/31/2018  Reason for Consultation/Follow-up: Pain control  Subjective:  patient is actively dying She is unresponsive Eyes some what open, fixed gaze Doesn't respond Mother at bedside, sister also present.   It is the patient's 36 th birthday today. Offered active listening and supportive care to the family at bedside.       PPS 10%  See below.   Length of Stay: 6  Current Medications: Scheduled Meds:  . lidocaine  1 patch Transdermal Q24H  . mouth rinse  15 mL Mouth Rinse BID    Continuous Infusions: . sodium chloride 10 mL/hr at 05/06/18 1013  . morphine 2 mg/hr (05/08/18 0046)    PRN Meds: acetaminophen **OR** acetaminophen, LORazepam, morphine **AND** morphine, naphazoline-glycerin, ondansetron **OR** ondansetron (ZOFRAN) IV  Physical Exam         Patient actively dying Unresponsive   shallow breathing regular Has some edema Eyes fixed with upward gaze, she is essentially unresponsive.   Vital Signs: BP 117/72 (BP Location: Left Arm)   Pulse (!) 124   Temp 98.7 F (37.1 C) (Oral)   Resp (!) 22   Ht 5' (1.524 m)   Wt 48.5 kg   SpO2 98%   BMI 20.88 kg/m  SpO2: SpO2: 98 % O2 Device: O2 Device: Nasal Cannula O2 Flow Rate: O2 Flow Rate (L/min): 15 L/min  Intake/output summary:   Intake/Output Summary (Last 24 hours) at 05/09/2018 1124 Last data filed at 05/09/2018 0544 Gross per 24 hour  Intake -  Output 300 ml  Net -300 ml   LBM: Last BM Date: 05/03/18 Baseline Weight: Weight: 48.5 kg Most recent weight: Weight: 48.5 kg PPS 10%     Palliative Assessment/Data:    Flowsheet Rows     Most Recent Value  Intake Tab  Referral Department  Hospitalist  Unit at Time of Referral  ER  Palliative Care Primary Diagnosis  Cancer  Date Notified  05/03/18  Palliative Care Type  New Palliative care  Reason for referral  Clarify Goals of Care  Date of Admission  05/01/2018  Date first seen by  Palliative Care  05/04/18  # of days Palliative referral response time  1 Day(s)  # of days IP prior to Palliative referral  1  Clinical Assessment  Psychosocial & Spiritual Assessment  Palliative Care Outcomes      Patient Active Problem List   Diagnosis Date Noted  . Dying care   . Palliative care by specialist   . Goals of care, counseling/discussion   . Acute respiratory failure with hypoxia ([place]) 05/03/2018  . Chest pain 05/03/2018  . Lactic acidosis 05/03/2018  . Palliative care encounter 03/24/2018  . Pathologic thoracic fracture 02/15/2018  . 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 Normocytic normochromic anemia 02/15/2018  . Hypothyroidism 02/15/2018  . Respiratory distress 02/14/2018  . Cough with hemoptysis 04/23/2012  . Thyroid cancer ([place]) 04/23/2012  . 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 Known metastatic disease to spine, bone, brain &lungs with prior hemoptysis s/p bronchial artery embolization.  Patient currently admitted to stepdown unit at [facility] with chest pain and shortness of breath.   Assessment:  chest pain Tachypnea PPS 30% Anxiety Subjective sensation of dyspnea.  Actively dying.   Recommendations/Plan:    IV Morphine drip with bolus doses also available. Continue to up titrate based on comfort needs, currently comfortable at 2 mg/hour.   continue supplemental O2, lidoderm patch and  PRN IV Ativan for supportive care.   KVO IVF and D/C PO medications not contributory to comfort.    Appreciate chaplain consult and follow up.   Eye drops for comfort care have been ordered.    actively dying, prognosis likely not more than few hours to some very limited number of days at this point.      Anticipate hospital death.   Chaplain follow up.    comfort cart has been provided for family.   Discussed with sister at the bedside about the patient's current hospitalization and her underlying serious illness and why she is currently on comfort measures. All of her questions addressed to the best of my ability.     Code Status:    Code Status Orders  (From admission, onward)         Start     Ordered   05/03/18 0431  Limited resuscitation (code)  Continuous    Question Answer Comment  In the event of cardiac or respiratory ARREST: Initiate Code Blue, Call Rapid Response Yes   In the event of cardiac or respiratory ARREST: Perform CPR Yes   In the event of cardiac or respiratory ARREST: Perform Intubation/Mechanical Ventilation No   In the event of cardiac or respiratory ARREST: Use NIPPV/BiPAp only if indicated Yes   In the event of cardiac or respiratory ARREST: Administer ACLS medications if indicated Yes   In the event of cardiac or respiratory ARREST: Perform Defibrillation or Cardioversion if indicated Yes      05/03/18 0430        Code Status History    Date Active Date Inactive Code Status Order ID Comments User Context   05/03/2018 0245 05/03/2018 0430 Full Code [id number]  Ivor Costa, MD ED   02/14/2018 2324 02/18/2018 2023 Full Code [id number]  Norval Morton, MD ED   04/23/2012 0647 04/25/2012 0111 Full Code [id number]  Orvan Falconer, MD Inpatient  Prognosis:    Hours to some very limited number of days.  Discharge Planning:  Anticipated hospital death.   Care plan was discussed with  Patient's mother and sister at bedside this am.   Thank you for  allowing the Palliative Medicine Team to assist in the care of this patient.   Time In: 10 Time Out: 10.35 Total Time 35 Prolonged Time Billed  no       Greater than 50%  of this time was spent counseling and coordinating care related to the above assessment and plan.  Loistine Chance, MD [phone] Please contact Palliative Medicine Team phone at [phone] for questions and concerns.

## 2018-05-09 NOTE — Care Management Important Message (Signed)
Important Message  Patient Details  Name: Kendra Mendez MRN: [id number] Date of Birth: October 01, 1970   Medicare Important Message Given:  Yes    [name] Care 05/09/2018, 9:08 AM

## 2018-05-09 NOTE — Progress Notes (Signed)
PROGRESS NOTE  Kendra Mendez BHA:[id number] DOB: 1970/07/30 DOA: 05/09/2018 PCP: System, Provider Not In   LOS: 6 days   Brief Narrative / Interim history: 48 year old female with history of stage IV follicular thyroid carcinoma/Hurthle cell with metastasis to the spine, bone, brain and larynx, prior history of hemoptysis requiring embolization, status post radiation therapy to T10/L3, anemia, who came in with chest pain and shortness of breath for the last couple of weeks. In the ED she had a positive d-dimer and lactic acidosis, she was placed on antibiotics empirically and admitted to the stepdown.  Critical care was consulted.  Subjective: -being bathed, her eyes are open but not communicating   Assessment & Plan: Principal Problem:   Acute respiratory failure with hypoxia (HCC) Active Problems:   Thyroid cancer (HCC)   Normocytic normochromic anemia   Hypothyroidism   Chest pain   Lactic acidosis   Palliative care by specialist   Goals of care, counseling/discussion   Dying care   Principal Problem Stage IV metastatic thyroid cancer  -With significant bone and lung metastasis as well as probable epidural metastasis at T10 -Here with chest pain, probably related to her underlying metastatic disease.  Chest pain is reproducible with palpation.  EKG and troponins did not show ischemia, serial cardiac enzymes and EKG did not show any active ischemia -Followed at [facility] by Dr. Dennison Nancy, I have discussed with her over the phone -Her disease seems to be progressing significantly, she has impressive tumor burden with lactic acidosis, excruciating pain, tachycardia and tachypnea -She is declining despite treatment and it appears that she is entering the dying process.  Now on comfort care  Active Problems Age-indeterminate DVT -Would benefit from anticoagulation however patient has a history of massive hemoptysis requiring IR embolization past.  Has not had any bleeding in the past 2  years.  VQ scan was of indeterminate.  Unable to do CT angiogram due to severe dye allergy despite premedication. Possibly also has underlying PE, risks and benefits of anticoagulation versus IVC filter were discussed with patient, she declined anticoagulation and is status post IVC filter per IR  Severe lactic acidosis causing metabolic acidosis with partial respiratory compensation -Likely related to aggressive tumor burden/malignancy Anemia of chronic disease Hypothyroidism   Scheduled Meds: . lidocaine  1 patch Transdermal Q24H  . mouth rinse  15 mL Mouth Rinse BID   Continuous Infusions: . sodium chloride 10 mL/hr at 05/06/18 1013  . morphine 2 mg/hr (05/08/18 0046)   PRN Meds:.acetaminophen **OR** acetaminophen, LORazepam, morphine **AND** morphine, naphazoline-glycerin, ondansetron **OR** ondansetron (ZOFRAN) IV  DVT prophylaxis: comfort Code Status: DNR Family Communication: sister at bedside  Disposition Plan: Anticipate in hospital death  Consultants:   Critical care  Palliative care  Procedures:   None   Antimicrobials:  None    Objective: Vitals:   05/07/18 0600 05/08/18 0505 05/08/18 2009 05/09/18 0923  BP:  127/90 113/68 117/72  Pulse: (!) 120 (!) 121 (!) 114 (!) 124  Resp: 14 (!) 24 (!) 22 (!) 22  Temp:  99.1 F (37.3 C) 98.7 F (37.1 C) 98.7 F (37.1 C)  TempSrc:  Oral Axillary Oral  SpO2: 97% 98% 98%   Weight:      Height:        Intake/Output Summary (Last 24 hours) at 05/09/2018 1017 Last data filed at 05/09/2018 0544 Gross per 24 hour  Intake -  Output 300 ml  Net -300 ml   Filed Weights   05/22/2018 2151 05/03/18  7494 05/05/18 0325  Weight: 48.5 kg 51.5 kg 48.5 kg    Examination:  Constitutional: comfortable Respiratory: Shallow respirations Cardiovascular: tachycardic    Data Reviewed: I have independently reviewed following labs and imaging studies    CBC: Recent Labs  Lab 05-21-2018 2212 05/03/18 0407 05/03/18 0644  05/04/18 0300 05/05/18 0240 05/06/18 0616  WBC 4.9  --  3.8* 5.2 6.4 7.3  NEUTROABS 3.3  --   --   --   --   --   HGB 9.7* 8.9* 9.0* 9.6* 9.6* 9.5*  HCT 33.0* 29.6* 30.7* 33.6* 34.0* 33.1*  MCV 92.7  --  93.0 96.8 98.8 97.6  PLT 254  --  218 274 249 496   Basic Metabolic Panel: Recent Labs  Lab 05/21/18 2212 05/03/18 0644 05/04/18 0300 05/05/18 0240 05/06/18 0616  NA 141 137 137 137 136  K 4.4 4.2 4.7 4.9 5.6*  CL 103 105 107 108 106  CO2 21* 18* 9* 9* 12*  GLUCOSE 108* 94 115* 107* 87  BUN 17 12 10 13  21*  CREATININE 0.51 0.40* 0.65 0.52 0.59  CALCIUM 9.8 8.9 9.3 9.8 9.6   GFR: Estimated Creatinine Clearance: 60.4 mL/min (by C-G formula based on SCr of 0.59 mg/dL). Liver Function Tests: Recent Labs  Lab 05-21-2018 2212 05/05/18 0240  AST 82* 37  ALT 16 18  ALKPHOS 86 70  BILITOT 0.4 0.5  PROT 7.1 7.4  ALBUMIN 3.5 4.1   No results for input(s): LIPASE, AMYLASE in the last 168 hours. No results for input(s): AMMONIA in the last 168 hours. Coagulation Profile: Recent Labs  Lab 05-21-18 2212  INR 1.2   Cardiac Enzymes: Recent Labs  Lab 05/05/18 0932 05/05/18 1617 05/05/18 2059  TROPONINI <0.03 <0.03 <0.03   BNP (last 3 results) No results for input(s): PROBNP in the last 8760 hours. HbA1C: No results for input(s): HGBA1C in the last 72 hours. CBG: No results for input(s): GLUCAP in the last 168 hours. Lipid Profile: No results for input(s): CHOL, HDL, LDLCALC, TRIG, CHOLHDL, LDLDIRECT in the last 72 hours. Thyroid Function Tests: No results for input(s): TSH, T4TOTAL, FREET4, T3FREE, THYROIDAB in the last 72 hours. Anemia Panel: No results for input(s): VITAMINB12, FOLATE, FERRITIN, TIBC, IRON, RETICCTPCT in the last 72 hours. Urine analysis:    Component Value Date/Time   COLORURINE STRAW (A) 05/03/2018 0411   APPEARANCEUR CLEAR 05/03/2018 0411   LABSPEC 1.018 05/03/2018 0411   PHURINE 5.0 05/03/2018 0411   GLUCOSEU NEGATIVE 05/03/2018 0411    HGBUR NEGATIVE 05/03/2018 0411   BILIRUBINUR NEGATIVE 05/03/2018 0411   KETONESUR 5 (A) 05/03/2018 0411   PROTEINUR NEGATIVE 05/03/2018 0411   UROBILINOGEN 1.0 02/11/2011 1051   NITRITE NEGATIVE 05/03/2018 0411   LEUKOCYTESUR NEGATIVE 05/03/2018 0411   Sepsis Labs: Invalid input(s): PROCALCITONIN, LACTICIDVEN  Recent Results (from the past 240 hour(s))  Culture, blood (Routine x 2)     Status: None   Collection Time: 2018-05-21 10:18 PM  Result Value Ref Range Status   Specimen Description   Final    BLOOD BLOOD LEFT FOREARM Performed at [facility] [address], [place], [place] [address]    Special Requests   Final    BOTTLES DRAWN AEROBIC AND ANAEROBIC Blood Culture adequate volume Performed at [facility] [address] [place], [place] [address]    Culture   Final    NO GROWTH 5 DAYS Performed at [facility], [place],  [place] [address]    Report Status 05/08/2018 FINAL  Final  Culture, blood (Routine x 2)     Status: None   Collection Time: 05/22/2018 11:31 PM  Result Value Ref Range Status   Specimen Description   Final    BLOOD RIGHT ANTECUBITAL Performed at [facility] [address] [place], [place] [address]    Special Requests   Final    BOTTLES DRAWN AEROBIC AND ANAEROBIC Blood Culture adequate volume Performed at [facility] [address], [place], [place] [address]    Culture   Final    NO GROWTH 5 DAYS Performed at [facility], [place] [address] [place], [place] [address]    Report Status 05/08/2018 FINAL  Final  Urine culture     Status: None   Collection Time: 05/03/18  4:11 AM  Result Value Ref Range Status   Specimen Description   Final    URINE, CLEAN CATCH Performed at [facility], [place] [address], [place], [place] [address]    Special Requests   Final    NONE Performed at [facility], [place]  [address], [place], [place] [address]    Culture   Final    NO GROWTH Performed at [facility], [place] [address] [place], [place] [address]    Report Status 05/04/2018 FINAL  Final  MRSA PCR Screening     Status: None   Collection Time: 05/03/18  5:05 AM  Result Value Ref Range Status   MRSA by PCR NEGATIVE NEGATIVE Final    Comment:        The GeneXpert MRSA Assay (FDA approved for NASAL specimens only), is one component of a comprehensive MRSA colonization surveillance program. It is not intended to diagnose MRSA infection nor to guide or monitor treatment for MRSA infections. Performed at [facility], [place] [address], [place],  [address]       Radiology Studies: No results found.  Marzetta Board, MD, PhD [facility]  Contact via  [email or web address]  [place] P: [phone]  F: [phone]

## 2018-05-10 DIAGNOSIS — J189 Pneumonia, unspecified organism: Secondary | ICD-10-CM

## 2018-05-10 MED ORDER — MORPHINE SULFATE (PF) 2 MG/ML IV SOLN: 1.0000 mg | INTRAVENOUS | Status: DC | PRN

## 2018-05-10 NOTE — Progress Notes (Signed)
Daily Progress Note   Patient Name: Kendra Mendez       Date: 05/10/2018 DOB: 12-Feb-1971  Age: 48 y.o. MRN#: [id number] Attending Physician: Caren Griffins, MD Primary Care Physician: System, Provider Not In Admit Date: 05/17/2018  Reason for Consultation/Follow-up: Pain control  Subjective: I saw and examined Kendra Mendez initially this AM with her sister and mother present.  They reported understanding that she is dying and wanted to continue to ensure she does not suffer.  I was later paged by RN that her son and daughter desired to have morphine infusion stopped.  I went to room and discussed with her daughter, Kendra Mendez, and her nephew.  Kendra Mendez reports that she believes her mother is overmedicated and that she was "fine" until she came to the hospital and was placed on morphine infusion.  She wanted morphine infusion to be stopped, "until my mom wakes up and tells me that she wants it back."  Attempted to discuss documentation that pain was out of control and made recommendation that since infusion is now discontinued, we continue to utilize as needed dosing if her symptoms become out of control.  She reports that she disagrees with giving any narcotics, "even is she is crying" until she sees her mother wake up and hears her mother state that she wants more pain medication.  I was then able to call and speak with her son, Kendra Mendez [phone]).  He reports that he is his mother's HCPOA.  We discussed her clinical course and he reports that he was able to speak with Dr. Cruzita Lederer this AM and having a better idea of the significant pain that his mother was in.  We discussed his request to stop infusion and my recommendation to utilize prn medication if pain gets out of control.  He was agreeable to  staring morphine 1mg  every hour as needed for pain.  He is also concerned with her being able to eat if she wakes up.  We discussed plan to allow feeding as desired despite risk of aspiration.  He stated he would call and discuss this plan with his family, including his sister who is in the room.    PPS 10%  Length of Stay: 7  Current Medications: Scheduled Meds:  . lidocaine  1 patch Transdermal Q24H  . mouth  rinse  15 mL Mouth Rinse BID    Continuous Infusions: . sodium chloride 10 mL/hr at 05/06/18 1013    PRN Meds: acetaminophen **OR** acetaminophen, LORazepam, morphine injection, naphazoline-glycerin, ondansetron **OR** ondansetron (ZOFRAN) IV  Physical Exam         Unresponsive   shallow breathing regular Has some edema Eyes fixed with upward gaze, she is essentially unresponsive.   Vital Signs: BP 120/64 (BP Location: Right Arm)   Pulse (!) 130   Temp 98.6 F (37 C)   Resp (!) 24   Ht 5' (1.524 m)   Wt 48.5 kg   SpO2 96%   BMI 20.88 kg/m  SpO2: SpO2: 96 % O2 Device: O2 Device: Nasal Cannula O2 Flow Rate: O2 Flow Rate (L/min): 5 L/min  Intake/output summary:   Intake/Output Summary (Last 24 hours) at 05/10/2018 1445 Last data filed at 05/10/2018 1254 Gross per 24 hour  Intake 0 ml  Output 1400 ml  Net -1400 ml   LBM: Last BM Date: 05/03/18 Baseline Weight: Weight: 48.5 kg Most recent weight: Weight: 48.5 kg PPS 10%      Palliative Assessment/Data:    Flowsheet Rows     Most Recent Value  Intake Tab  Referral Department  Hospitalist  Unit at Time of Referral  ER  Palliative Care Primary Diagnosis  Cancer  Date Notified  05/03/18  Palliative Care Type  New Palliative care  Reason for referral  Clarify Goals of Care  Date of Admission  05/19/2018  Date first seen by Palliative Care  05/04/18  # of days Palliative referral response time  1 Day(s)  # of days IP prior to Palliative referral  1  Clinical Assessment  Psychosocial & Spiritual  Assessment  Palliative Care Outcomes      Patient Active Problem List   Diagnosis Date Noted  . Dying care   . Palliative care by specialist   . Goals of care, counseling/discussion   . Acute respiratory failure with hypoxia ([place]) 05/03/2018  . Chest pain 05/03/2018  . Lactic acidosis 05/03/2018  . Palliative care encounter 03/24/2018  . Pathologic thoracic fracture 02/15/2018  . 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 Normocytic normochromic anemia 02/15/2018  . Hypothyroidism 02/15/2018  . Respiratory distress 02/14/2018  . Cough with hemoptysis 04/23/2012  . Thyroid cancer ([place]) 04/23/2012  . 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 Known metastatic disease to spine, bone, brain &lungs with prior hemoptysis s/p bronchial artery embolization.  Patient currently admitted to stepdown unit at [facility] with chest pain and shortness of breath.   Assessment:  chest pain Tachypnea PPS 30% Anxiety Subjective sensation of dyspnea.  Actively dying.   Recommendations/Plan:   IV Morphine drip discontinued per family request.  Placed order for morphine 1mg  every hour as needed for pain after discussion with son.  Continue supplemental O2, lidoderm patch and PRN IV Ativan for supportive care.   Son reports understanding how ill his mother is, but reports difficult emotionally as her birthday was yesterday and his birthday is today.  Her daughter is struggling with her decline and reports being confident that she is going to recover.    Comfort cart has been provided for family.     Code Status:    Code Status Orders  (  From admission, onward)         Start     Ordered   05/03/18 0431  Limited resuscitation (code)  Continuous    Question Answer Comment  In the event of  cardiac or respiratory ARREST: Initiate Code Blue, Call Rapid Response Yes   In the event of cardiac or respiratory ARREST: Perform CPR Yes   In the event of cardiac or respiratory ARREST: Perform Intubation/Mechanical Ventilation No   In the event of cardiac or respiratory ARREST: Use NIPPV/BiPAp only if indicated Yes   In the event of cardiac or respiratory ARREST: Administer ACLS medications if indicated Yes   In the event of cardiac or respiratory ARREST: Perform Defibrillation or Cardioversion if indicated Yes      05/03/18 0430        Code Status History    Date Active Date Inactive Code Status Order ID Comments User Context   05/03/2018 0245 05/03/2018 0430 Full Code [id number]  Ivor Costa, MD ED   02/14/2018 2324 02/18/2018 2023 Full Code [id number]  Norval Morton, MD ED   04/23/2012 0647 04/25/2012 0111 Full Code [id number]  Orvan Falconer, MD Inpatient       Prognosis:    Hours to some very limited number of days.  Discharge Planning:  Anticipated hospital death.   Care plan was discussed with  Patient's mother and sister at bedside this am.   Daughter in room and son via phone this afternoon.  Thank you for allowing the Palliative Medicine Team to assist in the care of this patient.   Time In: 1350 Time Out: 1435 Total Time 45 Prolonged Time Billed  no       Greater than 50%  of this time was spent counseling and coordinating care related to the above assessment and plan.  Micheline Rough, MD  Please contact Palliative Medicine Team phone at [phone] for questions and concerns.

## 2018-05-10 NOTE — Progress Notes (Signed)
Patient's daughter expresses that she would like the patient to start some form of nutrition supplement intravenously due to patient being too lethargic to eat anything orally. Explained to daughter that the primary goal for the patient at this moment is comfort and pain control.

## 2018-05-10 NOTE — Progress Notes (Signed)
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 In the ED she had a positive d-dimer and lactic acidosis, she was placed on antibiotics empirically and admitted to the stepdown.  Critical care was consulted.  Subjective: -Unresponsive, shallow breathing  Assessment & Plan: Principal Problem:   Acute respiratory failure with hypoxia (HCC) Active Problems:   Thyroid cancer (HCC)   Normocytic normochromic anemia   Hypothyroidism   Chest pain   Lactic acidosis   Palliative care by specialist   Goals of care, counseling/discussion   Dying care   Principal Problem Stage IV metastatic thyroid cancer  -With significant bone and lung metastasis as well as probable epidural metastasis at T10 -Here with chest pain, probably related to her underlying metastatic disease.  Chest pain is reproducible with palpation.  EKG and troponins did not show ischemia, serial cardiac enzymes and EKG did not show any active ischemia -Followed at [facility] by Dr. Dennison Nancy, I have discussed with her over the phone -Her disease seems to be progressing significantly, she has impressive tumor burden with lactic acidosis, excruciating pain, tachycardia and tachypnea -She is declining despite treatment and it appears that she is entering the dying process.  Now on comfort care and on morphine infusion  Active Problems Age-indeterminate DVT -Would benefit from anticoagulation however patient has a history of massive hemoptysis requiring IR embolization past.  Has not had any bleeding in the past 2  years.  VQ scan was of indeterminate.  Unable to do CT angiogram due to severe dye allergy despite premedication. Possibly also has underlying PE, risks and benefits of anticoagulation versus IVC filter were discussed with patient, she declined anticoagulation and is status post IVC filter per IR  Severe lactic acidosis causing metabolic acidosis with partial respiratory compensation -Likely related to aggressive tumor burden/malignancy Anemia of chronic disease Hypothyroidism  Goals of care -Discussed with son (POA) over the phone, he understands the need for morphine for pain burden and extreme discomfort that patient experienced last week, however he expressed concern that she is to lethargic and not waking up.  He also mentioned that she has not eaten in the last several days.  He was under the impression that she would have passed by now.  Discussed that she is expected to pass unfortunately but process may take a few days, and/or focus of care is geared towards comfort.  I have also discussed with Dr. Domingo Cocking with palliative care and he will touch base with the family as well  Scheduled Meds: . lidocaine  1 patch Transdermal Q24H  . mouth rinse  15 mL Mouth Rinse BID   Continuous Infusions: . sodium chloride 10 mL/hr at 05/06/18 1013  . morphine 1 mg/hr (05/09/18 2036)   PRN Meds:.acetaminophen **OR** acetaminophen, LORazepam, morphine **AND** morphine, naphazoline-glycerin, ondansetron **OR** ondansetron (ZOFRAN) IV  DVT prophylaxis: comfort Code Status: DNR Family Communication: sister at bedside  Disposition Plan: Anticipate in hospital death  Consultants:   Critical care  Palliative care  Procedures:   None   Antimicrobials:  None    Objective: Vitals:   05/08/18 0505 05/08/18 2009  05/09/18 0923 05/09/18 2202  BP: 127/90 113/68 117/72 120/64  Pulse: (!) 121 (!) 114 (!) 124 (!) 130  Resp: (!) 24 (!) 22 (!) 22 (!) 24  Temp: 99.1 F (37.3 C) 98.7 F (37.1 C) 98.7  F (37.1 C) 98.6 F (37 C)  TempSrc: Oral Axillary Oral   SpO2: 98% 98%  96%  Weight:      Height:        Intake/Output Summary (Last 24 hours) at 05/10/2018 1300 Last data filed at 05/10/2018 1254 Gross per 24 hour  Intake 0 ml  Output 1400 ml  Net -1400 ml   Filed Weights   05/25/2018 2151 05/03/18 0449 05/05/18 0325  Weight: 48.5 kg 51.5 kg 48.5 kg    Examination:  Constitutional: comfortable Respiratory: CTA Cardiovascular: tachycardic, no murmurs  Data Reviewed: I have independently reviewed following labs and imaging studies    CBC: Recent Labs  Lab 2018/05/26 0300 05/05/18 0240 05/06/18 0616  WBC 5.2 6.4 7.3  HGB 9.6* 9.6* 9.5*  HCT 33.6* 34.0* 33.1*  MCV 96.8 98.8 97.6  PLT 274 249 732   Basic Metabolic Panel: Recent Labs  Lab May 26, 2018 0300 05/05/18 0240 05/06/18 0616  NA 137 137 136  K 4.7 4.9 5.6*  CL 107 108 106  CO2 9* 9* 12*  GLUCOSE 115* 107* 87  BUN 10 13 21*  CREATININE 0.65 0.52 0.59  CALCIUM 9.3 9.8 9.6   GFR: Estimated Creatinine Clearance: 60.4 mL/min (by C-G formula based on SCr of 0.59 mg/dL). Liver Function Tests: Recent Labs  Lab 05/05/18 0240  AST 37  ALT 18  ALKPHOS 70  BILITOT 0.5  PROT 7.4  ALBUMIN 4.1   No results for input(s): LIPASE, AMYLASE in the last 168 hours. No results for input(s): AMMONIA in the last 168 hours. Coagulation Profile: No results for input(s): INR, PROTIME in the last 168 hours. Cardiac Enzymes: Recent Labs  Lab 05/05/18 0932 05/05/18 1617 05/05/18 2059  TROPONINI <0.03 <0.03 <0.03   BNP (last 3 results) No results for input(s): PROBNP in the last 8760 hours. HbA1C: No results for input(s): HGBA1C in the last 72 hours. CBG: No results for input(s): GLUCAP in the last 168 hours. Lipid Profile: No results for input(s): CHOL, HDL, LDLCALC, TRIG, CHOLHDL, LDLDIRECT in the last 72 hours. Thyroid Function Tests: No results for input(s): TSH, T4TOTAL, FREET4, T3FREE, THYROIDAB in the last  72 hours. Anemia Panel: No results for input(s): VITAMINB12, FOLATE, FERRITIN, TIBC, IRON, RETICCTPCT in the last 72 hours. Urine analysis:    Component Value Date/Time   COLORURINE STRAW (A) 05/03/2018 0411   APPEARANCEUR CLEAR 05/03/2018 0411   LABSPEC 1.018 05/03/2018 0411   PHURINE 5.0 05/03/2018 0411   GLUCOSEU NEGATIVE 05/03/2018 0411   HGBUR NEGATIVE 05/03/2018 0411   BILIRUBINUR NEGATIVE 05/03/2018 0411   KETONESUR 5 (A) 05/03/2018 0411   PROTEINUR NEGATIVE 05/03/2018 0411   UROBILINOGEN 1.0 02/11/2011 1051   NITRITE NEGATIVE 05/03/2018 0411   LEUKOCYTESUR NEGATIVE 05/03/2018 0411   Sepsis Labs: Invalid input(s): PROCALCITONIN, LACTICIDVEN  Recent Results (from the past 240 hour(s))  Culture, blood (Routine x 2)     Status: None   Collection Time: 05/04/2018 10:18 PM  Result Value Ref Range Status   Specimen Description   Final    BLOOD BLOOD LEFT FOREARM Performed at [facility] [address], [place], [place] [address]    Special Requests   Final    BOTTLES DRAWN AEROBIC AND ANAEROBIC Blood Culture  adequate volume Performed at [place] [address], [place], [place] [address]    Culture   Final    NO GROWTH 5 DAYS Performed at [facility], [place] [address] [place], [place] [address]    Report Status 05/08/2018 FINAL  Final  Culture, blood (Routine x 2)     Status: None   Collection Time: 05/07/2018 11:31 PM  Result Value Ref Range Status   Specimen Description   Final    BLOOD RIGHT ANTECUBITAL Performed at [facility] [address], [place], [place] [address]    Special Requests   Final    BOTTLES DRAWN AEROBIC AND ANAEROBIC Blood Culture adequate volume Performed at [facility] [address], [place], [place] [address]    Culture   Final    NO GROWTH 5 DAYS Performed at [facility], [place] [address] [place], [place] [address]    Report Status 05/08/2018  FINAL  Final  Urine culture     Status: None   Collection Time: 05/03/18  4:11 AM  Result Value Ref Range Status   Specimen Description   Final    URINE, CLEAN CATCH Performed at [facility], [place] [address] [place], [place] [address]    Special Requests   Final    NONE Performed at [facility], [place] [address] [place], [place] [address]    Culture   Final    NO GROWTH Performed at [facility], [place] [address], [place], [place] [address]    Report Status 05/04/2018 FINAL  Final  MRSA PCR Screening     Status: None   Collection Time: 05/03/18  5:05 AM  Result Value Ref Range Status   MRSA by PCR NEGATIVE NEGATIVE Final    Comment:        The GeneXpert MRSA Assay (FDA approved for NASAL specimens only), is one component of a comprehensive MRSA colonization surveillance program. It is not intended to diagnose MRSA infection nor to guide or monitor treatment for MRSA infections. Performed at [facility], [place] [address], [place], [place] [address]       Radiology Studies: No results found.  Marzetta Board, MD, PhD [facility]  Contact via  [email or web address]  [place] P: [phone]  F: [phone]

## 2018-05-10 NOTE — Progress Notes (Signed)
This RN called into patient's room by family. Patient's daughter and son request to discontinue the IV morphine drip. Morphine drip stopped and turned on. RN notified attending MD, Cruzita Lederer and palliative MD, Domingo Cocking. Will continue to monitor patient.

## 2018-05-11 MED ORDER — MORPHINE SULFATE (PF) 2 MG/ML IV SOLN
0.5000 mg | INTRAVENOUS | Status: DC | PRN
  Administered 2018-05-11 (×5): 0.5 mg via INTRAVENOUS
  Filled 2018-05-11 (×4): qty 1

## 2018-05-11 MED ORDER — MORPHINE SULFATE (PF) 2 MG/ML IV SOLN
INTRAVENOUS | Status: AC
  Administered 2018-05-11: 0.5 mg via INTRAVENOUS
  Filled 2018-05-11: qty 1

## 2018-06-01 NOTE — Discharge Summary (Signed)
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 In the ED she had a positive d-dimer and lactic acidosis, she was placed on antibiotics empirically and admitted to the stepdown.  Stage IV metastatic thyroid cancer  -With significant bone and lung metastasis as well as probable epidural metastasis at T10 -Her disease progressed  significantly, she has impressive tumor burden with lactic acidosis, excruciating pain, tachycardia and tachypnea She has  declined despite treatment and transitioned to comfort care and on morphine infusion and she passed away.    Final Diagnoses:  Acute respiratory failure with hypoxia (HCC)   Thyroid cancer (HCC)   Normocytic normochromic anemia   Hypothyroidism   Chest pain   Lactic acidosis   Palliative care by specialist   Goals of care, counseling/discussion   Dying care    The results of significant diagnostics from this hospitalization (including imaging, microbiology, ancillary and laboratory) are listed below for reference.    Significant Diagnostic Studies: Ct Abdomen Pelvis Wo Contrast  Result Date: 05/04/2018 CLINICAL DATA:  Nausea and vomiting.  Elevated serum lactic acid EXAM: CT ABDOMEN AND PELVIS WITHOUT CONTRAST TECHNIQUE: Multidetector CT imaging of the abdomen and pelvis was performed following the standard protocol without IV contrast. COMPARISON:  12/03/2003.  Abdominal CT report from [facility] 12/03/2017 FINDINGS: Lower chest: Bulky pulmonary metastatic disease with  staging chest CT performed 2 months ago. No superimposed acute finding. Hepatobiliary: No focal liver abnormality. Distended gallbladder without superimposed calcified stoneor inflammation. Pancreas: Atrophic appearance of the central pancreas without acute finding or ductal dilatation Spleen: Unremarkable. Adrenals/Urinary Tract: Negative adrenals. No hydronephrosis or ureteral stone. 2 mm right lower pole calculus. Unremarkable bladder. Stomach/Bowel: No obstruction. No evidence of bowel inflammation. Moderate stool retention Vascular/Lymphatic: No acute vascular finding without contrast. Atherosclerotic calcification. No mass or adenopathy. Reproductive:Negative Other: No ascites or pneumoperitoneum. Musculoskeletal: Widespread osseous metastatic disease throughout the visualized skeleton. There has been fixation of the proximal right femur with a partially visualized adjacent soft tissue mass. Chronic compression fracture of L3 with retropulsion, probable epidural tumor, and spinal stenosis-known from 03/13/2018 outside lumbar CT report at [facility]. IMPRESSION: 1. No acute finding. 2. Dilated gallbladder without superimposed inflammation or calcified stone. 3. Known widespread pulmonary and osseous metastatic disease Electronically Signed   By: Monte Fantasia M.D.   On: 05/04/2018 07:00   Dg Chest 2 View  Result Date: May 25, 2018 CLINICAL DATA:  Metastatic thyroid cancer. Pneumonia. EXAM: CHEST - 2 VIEW COMPARISON:  Chest CT 02/15/2018 FINDINGS: Numerous large masses throughout both lungs. Cardiomediastinal size is normal. No discrete airspace consolidation identified. Large callus of the right clavicle is unchanged. Osseous lesions are better characterized on prior CT. There is extensive erosion of the left scapula. IMPRESSION: Extensive metastatic disease to the lungs and left scapula. No superimposed acute airspace disease. Electronically Signed   By: Ulyses Jarred M.D.   On: 2018-05-25 22:22   Ir Ivc Filter  Plmt / S&i /img Guid/mod Sed  Result Date: 05/05/2018 INDICATION: 48 year old with metastatic thyroid cancer, age-indeterminate calf DVT and an intermediate V/Q scan. Patient  does not want anticoagulation due to metastatic disease and history of hemoptysis. Plan for IVC filter placement. Patient also has a severe contrast allergy and plan to use carbon dioxide for imaging. EXAM: IVC FILTER PLACEMENT; IVC VENOGRAM; ULTRASOUND FOR VASCULAR ACCESS Physician: Stephan Minister. Anselm Pancoast, MD MEDICATIONS: None. ANESTHESIA/SEDATION: None CONTRAST:  Comment access FLUOROSCOPY TIME:  Fluoroscopy Time: 9 minutes, 54 seconds, 517 mGy COMPLICATIONS: None immediate. PROCEDURE: Informed consent was obtained for an IVC venogram and filter placement. Ultrasound demonstrated a patent right internal jugular vein. Ultrasound images were obtained for documentation. The right side of the neck was prepped and draped in a sterile fashion. Maximal barrier sterile technique was utilized including caps, mask, sterile gowns, sterile gloves, sterile drape, hand hygiene and skin antiseptic. The skin was anesthetized with 1% lidocaine. A 21 gauge needle was directed into the vein with ultrasound guidance and a micropuncture dilator set was placed. A wire was advanced into the IVC. The filter sheath was advanced over the wire into the IVC. An IVC venogram was performed with carbon dioxide. Fluoroscopic images were obtained for documentation. Both renal veins were cannulated using a Berenstein catheter and Bentson wire. Bilateral renal veins were identified. A Bard Denali filter was deployed below the lowest renal vein. Bilateral renal veins were cannulated after filter placement to confirm placement below the renal veins. Vascular sheath was removed with manual compression. FINDINGS: IVC was patent. Bilateral renal veins were identified. The filter was deployed below the lowest renal vein. IMPRESSION: Successful placement of a retrievable IVC filter. PLAN: Due  to patient related comorbidities and/or clinical necessity, this IVC filter should be considered a permanent device. This patient will not be actively followed for future filter retrieval. Electronically Signed   By: Markus Daft M.D.   On: 05/05/2018 18:10   Nm Pulmonary Perf And Vent  Result Date: 05/03/2018 CLINICAL DATA:  Metastatic thyroid cancer, shortness of breath, chest pain EXAM: NUCLEAR MEDICINE VENTILATION - PERFUSION LUNG SCAN TECHNIQUE: Ventilation images were obtained in multiple projections using inhaled aerosol Tc-29m DTPA. Perfusion images were obtained in multiple projections after intravenous injection of Tc-55m MAA. Patient has an allergy to iodinated contrast material despite premedication in past. RADIOPHARMACEUTICALS:  29.7 mCi of Tc-13m DTPA aerosol inhalation and 4.1 mCi Tc61m MAA IV COMPARISON:  02/14/2018 Correlation: Chest radiographs 05/01/2018 FINDINGS: Ventilation: Central airway deposition of aerosol. Swallowed aerosol within stomach. Poor tracer delivery via aerosol into lung parenchyma bilaterally with suboptimal assessment of ventilation. Segmental and subsegmental ventilation defects are seen at the lower lungs bilaterally and at the lateral mid RIGHT lung Perfusion: Matching areas of diminished perfusion at the lateral RIGHT lung and at both lung bases. Remaining perfusion normal. Chest radiograph: Extensive BILATERAL pulmonary metastases at the mid to lower lungs bilaterally. IMPRESSION: Matching areas of diminished ventilation and perfusion in the mid to lower lungs bilaterally. Numerous BILATERAL large pulmonary metastases of the mid to lower lungs. The presence of matching ventilation, perfusion and radiographic abnormalities represents an intermediate probability for pulmonary embolism. Electronically Signed   By: Lavonia Dana M.D.   On: 05/03/2018 11:58   Vas [place] Lower Extremity Venous (dvt)  Result Date: 05/03/2018  Lower Venous Study Indications: Positive D-dimer.  Other Indications: History of Thyroid cancer. Risk Factors: Cancer metastasis. Limitations: Body habitus and musculoskeletal features. Comparison Study: Negative BLEV study on 02/15/2018 Performing Technologist: Rudell Cobb  Examination Guidelines: A complete evaluation includes B-mode imaging, spectral Doppler, color Doppler, and power Doppler as needed of all accessible portions of each  vessel. Bilateral testing is considered an integral part of a complete examination. Limited examinations for reoccurring indications may be performed as noted.  Right Venous Findings: +---------+---------------+---------+-----------+----------+-------------------+            Compressibility Phasicity Spontaneity Properties Summary              +---------+---------------+---------+-----------+----------+-------------------+  CFV       Full            Yes       Yes                                         +---------+---------------+---------+-----------+----------+-------------------+  SFJ       Full                                                                  +---------+---------------+---------+-----------+----------+-------------------+  FV Prox   Full                                                                  +---------+---------------+---------+-----------+----------+-------------------+  FV Mid    Full                                                                  +---------+---------------+---------+-----------+----------+-------------------+  FV Distal Full                                                                  +---------+---------------+---------+-----------+----------+-------------------+  PFV       Full                                                                  +---------+---------------+---------+-----------+----------+-------------------+  POP       Full            Yes       Yes                                          +---------+---------------+---------+-----------+----------+-------------------+  PTV       None  Age Indeterminate    +---------+---------------+---------+-----------+----------+-------------------+  PERO                                                       not well visualized  +---------+---------------+---------+-----------+----------+-------------------+  Left Venous Findings: +---------+---------------+---------+-----------+----------+-------+            Compressibility Phasicity Spontaneity Properties Summary  +---------+---------------+---------+-----------+----------+-------+  CFV       Full            Yes       Yes                             +---------+---------------+---------+-----------+----------+-------+  SFJ       Full                                                      +---------+---------------+---------+-----------+----------+-------+  FV Prox   Full                                                      +---------+---------------+---------+-----------+----------+-------+  FV Mid    Full                                                      +---------+---------------+---------+-----------+----------+-------+  FV Distal Full                                                      +---------+---------------+---------+-----------+----------+-------+  PFV       Full                                                      +---------+---------------+---------+-----------+----------+-------+  POP       Full            Yes       Yes                             +---------+---------------+---------+-----------+----------+-------+  PTV       Full                                                      +---------+---------------+---------+-----------+----------+-------+  PERO      Full                                                      +---------+---------------+---------+-----------+----------+-------+  Summary: Right: Findings consistent with age indeterminate deep vein  thrombosis involving the right posterior tibial vein. No cystic structure found in the popliteal fossa. Left: There is no evidence of deep vein thrombosis in the lower extremity. No cystic structure found in the popliteal fossa.  *See table(s) above for measurements and observations. Electronically signed by Deitra Mayo MD on 05/03/2018 at 3:58:56 PM.    Final     Microbiology: No results found for this or any previous visit (from the past 240 hour(s)).   Labs: Basic Metabolic Panel: No results for input(s): NA, K, CL, CO2, GLUCOSE, BUN, CREATININE, CALCIUM, MG, PHOS in the last 168 hours. Liver Function Tests: No results for input(s): AST, ALT, ALKPHOS, BILITOT, PROT, ALBUMIN in the last 168 hours. No results for input(s): LIPASE, AMYLASE in the last 168 hours. No results for input(s): AMMONIA in the last 168 hours. CBC: No results for input(s): WBC, NEUTROABS, HGB, HCT, MCV, PLT in the last 168 hours. Cardiac Enzymes: No results for input(s): CKTOTAL, CKMB, CKMBINDEX, TROPONINI in the last 168 hours. D-Dimer No results for input(s): DDIMER in the last 72 hours. BNP: Invalid input(s): POCBNP CBG: No results for input(s): GLUCAP in the last 168 hours. Anemia work up No results for input(s): VITAMINB12, FOLATE, FERRITIN, TIBC, IRON, RETICCTPCT in the last 72 hours. Urinalysis    Component Value Date/Time   COLORURINE STRAW (A) 05/03/2018 0411   APPEARANCEUR CLEAR 05/03/2018 0411   LABSPEC 1.018 05/03/2018 0411   PHURINE 5.0 05/03/2018 0411   GLUCOSEU NEGATIVE 05/03/2018 0411   HGBUR NEGATIVE 05/03/2018 0411   BILIRUBINUR NEGATIVE 05/03/2018 0411   KETONESUR 5 (A) 05/03/2018 0411   PROTEINUR NEGATIVE 05/03/2018 0411   UROBILINOGEN 1.0 02/11/2011 1051   NITRITE NEGATIVE 05/03/2018 0411   LEUKOCYTESUR NEGATIVE 05/03/2018 0411   Sepsis Labs Invalid input(s): PROCALCITONIN,  WBC,  LACTICIDVEN     SIGNED:  Hosie Poisson, MD  [facility] 05/13/2018, 5:08  PM Pager   If 7PM-7AM, please contact night-coverage [email or web address] Password [email or web address]

## 2018-06-01 NOTE — Progress Notes (Signed)
Wasted morphine drip bag in the room with Graylin Shiver [facility] as a witness. Wasted about 36mLs, unable to get exact mLs left in bag and tubing.

## 2018-06-01 NOTE — Progress Notes (Signed)
Pts next of kin contact: Williemae Natter (Sister) [phone]  [facility]) [phone]  [facility] contacted: April Shore 05/26/2018-061

## 2018-06-01 NOTE — Progress Notes (Signed)
Pts sister and mother at the bedside expressed that pt needs some type of nutrition since she is too lethargic to have intake orally.Pt is comfort care and the goals of this type of care were explained.  Karleen Hampshire, MD paged and aware.

## 2018-06-01 DEATH — deceased
